# Patient Record
Sex: Male | Born: 1985 | Race: White | Hispanic: No | Marital: Single | Smoking: Current every day smoker
Health system: Southern US, Community
[De-identification: ages and names within clinical notes are randomized; demographics above are authoritative.]

## PROBLEM LIST (undated history)

## (undated) DIAGNOSIS — F32A Depression, unspecified: Secondary | ICD-10-CM

## (undated) DIAGNOSIS — F319 Bipolar disorder, unspecified: Secondary | ICD-10-CM

## (undated) DIAGNOSIS — F329 Major depressive disorder, single episode, unspecified: Secondary | ICD-10-CM

## (undated) DIAGNOSIS — F4000 Agoraphobia, unspecified: Secondary | ICD-10-CM

## (undated) DIAGNOSIS — F419 Anxiety disorder, unspecified: Secondary | ICD-10-CM

## (undated) DIAGNOSIS — F4320 Adjustment disorder, unspecified: Secondary | ICD-10-CM

## (undated) DIAGNOSIS — E119 Type 2 diabetes mellitus without complications: Secondary | ICD-10-CM

## (undated) HISTORY — DX: Anxiety disorder, unspecified: F41.9

## (undated) HISTORY — DX: Adjustment disorder, unspecified: F43.20

## (undated) HISTORY — DX: Agoraphobia, unspecified: F40.00

## (undated) HISTORY — DX: Type 2 diabetes mellitus without complications: E11.9

## (undated) HISTORY — DX: Bipolar disorder, unspecified: F31.9

## (undated) HISTORY — DX: Depression, unspecified: F32.A

## (undated) HISTORY — DX: Major depressive disorder, single episode, unspecified: F32.9

---

## 2014-11-13 ENCOUNTER — Ambulatory Visit: Payer: Self-pay | Admitting: Family Medicine

## 2015-01-13 ENCOUNTER — Ambulatory Visit: Payer: Self-pay | Admitting: Unknown Physician Specialty

## 2015-01-21 ENCOUNTER — Ambulatory Visit: Payer: Self-pay | Admitting: Unknown Physician Specialty

## 2015-02-17 ENCOUNTER — Ambulatory Visit (INDEPENDENT_AMBULATORY_CARE_PROVIDER_SITE_OTHER): Payer: Medicaid Other | Admitting: Unknown Physician Specialty

## 2015-02-17 ENCOUNTER — Encounter: Payer: Self-pay | Admitting: Unknown Physician Specialty

## 2015-02-17 VITALS — BP 154/95 | HR 68 | Temp 98.2°F | Ht 72.5 in | Wt 277.6 lb

## 2015-02-17 DIAGNOSIS — F410 Panic disorder [episodic paroxysmal anxiety] without agoraphobia: Secondary | ICD-10-CM | POA: Diagnosis not present

## 2015-02-17 DIAGNOSIS — F4323 Adjustment disorder with mixed anxiety and depressed mood: Secondary | ICD-10-CM | POA: Diagnosis not present

## 2015-02-17 DIAGNOSIS — F4001 Agoraphobia with panic disorder: Secondary | ICD-10-CM | POA: Insufficient documentation

## 2015-02-17 DIAGNOSIS — F329 Major depressive disorder, single episode, unspecified: Secondary | ICD-10-CM | POA: Insufficient documentation

## 2015-02-17 DIAGNOSIS — Z23 Encounter for immunization: Secondary | ICD-10-CM

## 2015-02-17 DIAGNOSIS — F332 Major depressive disorder, recurrent severe without psychotic features: Secondary | ICD-10-CM | POA: Diagnosis not present

## 2015-02-17 DIAGNOSIS — F4320 Adjustment disorder, unspecified: Secondary | ICD-10-CM | POA: Insufficient documentation

## 2015-02-17 MED ORDER — ARIPIPRAZOLE 5 MG PO TABS: 5.0000 mg | ORAL_TABLET | Freq: Every day | ORAL | Status: DC

## 2015-02-17 NOTE — Progress Notes (Signed)
BP 154/95 mmHg  Pulse 68  Temp(Src) 98.2 F (36.8 C)  Ht 6' 0.5" (1.842 m)  Wt 277 lb 9.6 oz (125.919 kg)  BMI 37.11 kg/m2   Subjective:    Patient ID: Kevin Meza, male    DOB: [birth year], 29 y.o.   MRN: [id number]  HPI: Kevin Meza is a 29 y.o. male  Chief Complaint  Patient presents with  . Establish Care    pt states he is very depressed and sad a lot of time, states he also has crying fits   Depression: Pt is here with his mother to discuss problems with depression. Mother states it has been going on for "quite some time."  He was not diagnosed with depression when he was younger but mother noticed he always played alone and always by himself.  Mother states bipolar runs on her side of the family.  He brought in an assessment by his counselor with a diagnosis of Agoraphobia, Generalized Anxiety disorder, Major depression, and adjustment disorder.  He can't keep a job due to anxiety, panic and depression.    He has troubles sleeping, he is tired and having little energy, problems eating, feeling bad about himself and trouble concentrating.  He states he has no thoughts about hurting himself in any way.  PHQ 9 is 18  He describes panic attacks every other day, states he has daily swings, he has crying spells, sleeps all day if he could.  He ++  Relevant past medical, surgical, family and social history reviewed and updated as indicated. Interim medical history since our last visit reviewed. Allergies and medications reviewed and updated.  Review of Systems  Per HPI unless specifically indicated above     Objective:    BP 154/95 mmHg  Pulse 68  Temp(Src) 98.2 F (36.8 C)  Ht 6' 0.5" (1.842 m)  Wt 277 lb 9.6 oz (125.919 kg)  BMI 37.11 kg/m2  Wt Readings from Last 3 Encounters:  02/17/15 277 lb 9.6 oz (125.919 kg)    Physical Exam  Constitutional: He is oriented to person, place, and time. He appears well-developed and well-nourished. No distress.  HENT:   Head: Normocephalic and atraumatic.  Eyes: Conjunctivae and lids are normal. Right eye exhibits no discharge. Left eye exhibits no discharge. No scleral icterus.  Cardiovascular: Normal rate, regular rhythm and normal heart sounds.   Pulmonary/Chest: Effort normal and breath sounds normal.  Abdominal: Normal appearance and bowel sounds are normal. He exhibits no distension. There is no splenomegaly or hepatomegaly. There is no tenderness.  Musculoskeletal: Normal range of motion.  Neurological: He is alert and oriented to person, place, and time.  Skin: Skin is intact. No rash noted. No pallor.  Psychiatric: He has a normal mood and affect. His behavior is normal. Judgment and thought content normal.    No results found for this or any previous visit.    Assessment & Plan:   Problem List Items Addressed This Visit      Unprioritized   Major depression   Agoraphobia with panic disorder   Panic disorder   Adjustment disorder    Other Visit Diagnoses    Immunization due    -  Primary    Relevant Orders    Flu Vaccine QUAD 36+ mos PF IM (Fluarix & Fluzone Quad PF) (Completed)       Discussed with Dr. Dossie Arbour.  Will start Abilify 5 mg QD with the option to increase to 10 mg/day.  He does not  want to go to a psychiatrist and has trouble trusting people.  Discussed no SSRIs right now due to possibility of bipolar  Follow up plan: Return in about 4 weeks (around 03/17/2015).

## 2015-02-17 NOTE — Patient Instructions (Signed)

## 2015-02-28 ENCOUNTER — Telehealth: Payer: Self-pay | Admitting: Unknown Physician Specialty

## 2015-02-28 NOTE — Telephone Encounter (Signed)
Called and spoke to patient. Patient states he is a frequent headache person and usually has them on the left side. Patient stated he has been doing good on the Abilify but since he has been taking it, he now is having stronger headaches on the right side and it makes the whole top of his head hurt. States when he gets the migraine "he just shuts down". Patient wants to know if there is something he can do for the migraines, or what exactly he should do. Patient also wants to know if he should take the Abilify tonight or if he should hold off. States he take it around 10:30 every night. I told him I would call him back before we leave today.

## 2015-02-28 NOTE — Telephone Encounter (Signed)
Ask him to try taking 1/2 of his Ability.  Thanks.

## 2015-02-28 NOTE — Telephone Encounter (Signed)
Pt requests call back about migraines. Please call ASAP. Thanks.

## 2015-02-28 NOTE — Telephone Encounter (Signed)
Called and let patient know what Cheryl said.  

## 2015-03-12 ENCOUNTER — Ambulatory Visit: Payer: Medicaid Other | Admitting: Unknown Physician Specialty

## 2015-03-14 ENCOUNTER — Encounter: Payer: Self-pay | Admitting: Unknown Physician Specialty

## 2015-03-14 ENCOUNTER — Ambulatory Visit (INDEPENDENT_AMBULATORY_CARE_PROVIDER_SITE_OTHER): Payer: Medicaid Other | Admitting: Unknown Physician Specialty

## 2015-03-14 VITALS — BP 124/81 | HR 63 | Temp 98.2°F | Ht 72.05 in | Wt 283.6 lb

## 2015-03-14 DIAGNOSIS — F3130 Bipolar disorder, current episode depressed, mild or moderate severity, unspecified: Secondary | ICD-10-CM

## 2015-03-14 DIAGNOSIS — F410 Panic disorder [episodic paroxysmal anxiety] without agoraphobia: Secondary | ICD-10-CM | POA: Diagnosis not present

## 2015-03-14 DIAGNOSIS — F319 Bipolar disorder, unspecified: Secondary | ICD-10-CM

## 2015-03-14 MED ORDER — LURASIDONE HCL 20 MG PO TABS: 20.0000 mg | ORAL_TABLET | Freq: Every day | ORAL | Status: DC

## 2015-03-14 NOTE — Patient Instructions (Signed)
Book "Don't Panic"  [facility] [phone] in [place]

## 2015-03-14 NOTE — Progress Notes (Signed)
   BP 124/81 mmHg  Pulse 63  Temp(Src) 98.2 F (36.8 C)  Ht 6' 0.05" (1.83 m)  Wt 283 lb 9.6 oz (128.64 kg)  BMI 38.41 kg/m2  SpO2 98%   Subjective:    Patient ID: Kevin Meza, male    DOB: [birth year], 29 y.o.   MRN: [id number]  HPI: Kevin Meza is a 29 y.o. male  Chief Complaint  Patient presents with  . Follow-up    F/u for depression, bipolar disorder, and to review amblify to see how it's working.  . Depression    Patient states he's still depressed  . Migraine    X's 2 days a week   Depression Pt is taking 5 mg of Abilify.  Pt states he is still depressed.  He did get a migraine when he first started but migraines are typical for hem.  Mom is here with him who feels the Abilify seems to be working in the AM but not in the afternoon.  No suicidal ideation.  He is teary and emotional.  He has not been on Latuda or any other mood stabilizer.  He is willing to see psychiatry.  He does go to counseling.  Does feel a little leveling   Relevant past medical, surgical, family and social history reviewed and updated as indicated. Interim medical history since our last visit reviewed. Allergies and medications reviewed and updated.  Review of Systems  Per HPI unless specifically indicated above     Objective:    BP 124/81 mmHg  Pulse 63  Temp(Src) 98.2 F (36.8 C)  Ht 6' 0.05" (1.83 m)  Wt 283 lb 9.6 oz (128.64 kg)  BMI 38.41 kg/m2  SpO2 98%  Wt Readings from Last 3 Encounters:  03/14/15 283 lb 9.6 oz (128.64 kg)  02/17/15 277 lb 9.6 oz (125.919 kg)    Physical Exam  Constitutional: He is oriented to person, place, and time. He appears well-developed and well-nourished. No distress.  HENT:  Head: Normocephalic and atraumatic.  Eyes: Conjunctivae and lids are normal. Right eye exhibits no discharge. Left eye exhibits no discharge. No scleral icterus.  Pulmonary/Chest: No respiratory distress.  Abdominal: Normal appearance. There is no splenomegaly or  hepatomegaly.  Musculoskeletal: Normal range of motion.  Neurological: He is alert and oriented to person, place, and time.  Skin: Skin is intact. No rash noted. No pallor.  Psychiatric: He has a normal mood and affect. His behavior is normal. Judgment and thought content normal.    No results found for this or any previous visit.    Assessment & Plan:   Problem List Items Addressed This Visit      Unprioritized   Panic disorder    Other Visit Diagnoses    Bipolar depression (HCC)    -  Primary    Failed Abilify.  Start Latuda 20 mg QD.      Relevant Orders    Ambulatory referral to Psychiatry        Follow up plan: Return in about 3 weeks (around 04/04/2015).

## 2015-03-18 ENCOUNTER — Ambulatory Visit: Payer: Medicaid Other | Admitting: Unknown Physician Specialty

## 2015-03-31 ENCOUNTER — Encounter: Payer: Self-pay | Admitting: Psychiatry

## 2015-03-31 ENCOUNTER — Ambulatory Visit (INDEPENDENT_AMBULATORY_CARE_PROVIDER_SITE_OTHER): Payer: Medicaid Other | Admitting: Psychiatry

## 2015-03-31 VITALS — BP 118/82 | HR 92 | Temp 97.8°F | Ht 72.0 in | Wt 288.2 lb

## 2015-03-31 DIAGNOSIS — F3132 Bipolar disorder, current episode depressed, moderate: Secondary | ICD-10-CM | POA: Diagnosis not present

## 2015-03-31 MED ORDER — LURASIDONE HCL 40 MG PO TABS: 40.0000 mg | ORAL_TABLET | Freq: Every day | ORAL | Status: DC

## 2015-03-31 MED ORDER — HYDROXYZINE PAMOATE 50 MG PO CAPS: 50.0000 mg | ORAL_CAPSULE | Freq: Every day | ORAL | Status: DC
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
## 2015-04-04 ENCOUNTER — Encounter: Payer: Self-pay | Admitting: Unknown Physician Specialty

## 2015-04-04 ENCOUNTER — Ambulatory Visit (INDEPENDENT_AMBULATORY_CARE_PROVIDER_SITE_OTHER): Payer: Medicaid Other | Admitting: Unknown Physician Specialty

## 2015-04-04 VITALS — BP 151/85 | HR 84 | Temp 98.4°F | Ht 72.6 in | Wt 282.6 lb

## 2015-04-04 DIAGNOSIS — F332 Major depressive disorder, recurrent severe without psychotic features: Secondary | ICD-10-CM | POA: Diagnosis not present

## 2015-04-04 DIAGNOSIS — M549 Dorsalgia, unspecified: Secondary | ICD-10-CM

## 2015-04-04 DIAGNOSIS — Z720 Tobacco use: Secondary | ICD-10-CM | POA: Insufficient documentation

## 2015-04-04 DIAGNOSIS — G8929 Other chronic pain: Secondary | ICD-10-CM

## 2015-04-04 DIAGNOSIS — L858 Other specified epidermal thickening: Secondary | ICD-10-CM | POA: Insufficient documentation

## 2015-04-04 DIAGNOSIS — Q829 Congenital malformation of skin, unspecified: Secondary | ICD-10-CM

## 2015-04-04 DIAGNOSIS — Z23 Encounter for immunization: Secondary | ICD-10-CM

## 2015-04-04 NOTE — Assessment & Plan Note (Signed)
Discussed with patient that his sedentary lifestyle, obesity and smoking all put a lot of strain on his back.  Discussed PT but pt lacks transportation.  He will consider u tube videos focusing on yoga for back pain

## 2015-04-04 NOTE — Assessment & Plan Note (Signed)
Patient ed.  Use OTC Lac Hydrin or Eucerin lotion

## 2015-04-04 NOTE — Assessment & Plan Note (Signed)
He would like to quit and I feel in the long run this will help his mood.  But, I am encouraging him to wait until medication is adjusted.

## 2015-04-04 NOTE — Progress Notes (Signed)
BP 151/85 mmHg  Pulse 84  Temp(Src) 98.4 F (36.9 C)  Ht 6' 0.6" (1.844 m)  Wt 282 lb 9.6 oz (128.187 kg)  BMI 37.70 kg/m2  SpO2 95%   Subjective:    Patient ID: Kevin Meza, male    DOB: [birth year], 29 y.o.   MRN: [id number]  HPI: Kevin PlowmanMatthew Baumler is a 29 y.o. male  Chief Complaint  Patient presents with  . Depression  . Back Pain    pt states he has been having some back pain mainly on right side, states he used to skate and had some falls and back has been hurting ever since  . Rash    pt states he has little bumps that itch on the inside of both arms and legs  . Nicotine Dependence    pt states he wants to talk about quitting smoking   Depression Pt states Kasandra KnudsenLatuda is working well. He went to see a psychiatrist and it didn't go well.  Kasandra KnudsenLatuda was doubled and changed the time of day.  He hasn't tried it yet.  She added Vistaril in the PM.  He has not returned to his counselor due to distance.   [facility] 04/04/2015 02/17/2015  Decreased Interest 2 3  Down, Depressed, Hopeless 3 3  PHQ - 2 Score 5 6  Altered sleeping 2 3  Tired, decreased energy 3 3  Change in appetite 3 2  Feeling bad or failure about yourself  3 3  Trouble concentrating 1 2  Moving slowly or fidgety/restless 2 0  Suicidal thoughts 0 2  PHQ-9 Score 19 21    Tobacco dependence Would like to quit smoking.  Pt states he has been able to quit smoking on the patches.      Back Pain This is a chronic (4 years ago fell on skateboard.  Broke wrist and wasn't able to do anything about  his back.  Sometimes it flares and can't tget off the floor.  Back rus helped.  ) problem. The problem occurs intermittently. The problem is unchanged. Pertinent negatives include no fever.  Rash Chronicity: This has been going for years.  He notices multiple small red bumps arms and thights.   The current episode started more than 1 year ago. The problem has been waxing and waning since onset. Rash  characteristics: No itching. He was exposed to nothing. Pertinent negatives include no fatigue or fever. Past treatments include nothing.     Relevant past medical, surgical, family and social history reviewed and updated as indicated. Interim medical history since our last visit reviewed. Allergies and medications reviewed and updated.  Review of Systems  Constitutional: Negative for fever and fatigue.  Musculoskeletal: Positive for back pain.  Skin: Positive for rash.    Per HPI unless specifically indicated above     Objective:    BP 151/85 mmHg  Pulse 84  Temp(Src) 98.4 F (36.9 C)  Ht 6' 0.6" (1.844 m)  Wt 282 lb 9.6 oz (128.187 kg)  BMI 37.70 kg/m2  SpO2 95%  Wt Readings from Last 3 Encounters:  04/04/15 282 lb 9.6 oz (128.187 kg)  03/31/15 288 lb 3.2 oz (130.727 kg)  03/14/15 283 lb 9.6 oz (128.64 kg)    Physical Exam  Constitutional: He is oriented to person, place, and time. He appears well-developed and well-nourished. No distress.  HENT:  Head: Normocephalic and atraumatic.  Eyes: Conjunctivae and lids are normal. Right eye exhibits no discharge. Left eye exhibits no  discharge. No scleral icterus.  Cardiovascular: Normal rate and regular rhythm.   Pulmonary/Chest: Effort normal and breath sounds normal. No respiratory distress.  Abdominal: Normal appearance and bowel sounds are normal. There is no splenomegaly or hepatomegaly.  Musculoskeletal: Normal range of motion.  Neurological: He is alert and oriented to person, place, and time.  Skin: Skin is intact. No rash noted. No pallor.  Psychiatric: He has a normal mood and affect. His behavior is normal. Judgment and thought content normal.    No results found for this or any previous visit.    Assessment & Plan:   Problem List Items Addressed This Visit      Unprioritized   Major depression (HCC)    Continue medication management with psychiatrist.  List of local counselors given.        Keratosis  pilaris    Patient ed.  Use OTC Lac Hydrin or Eucerin lotion      Chronic back pain    Discussed with patient that his sedentary lifestyle, obesity and smoking all put a lot of strain on his back.  Discussed PT but pt lacks transportation.  He will consider u tube videos focusing on yoga for back pain      Tobacco abuse    He would like to quit and I feel in the long run this will help his mood.  But, I am encouraging him to wait until medication is adjusted.         Other Visit Diagnoses    Immunization due    -  Primary    Relevant Orders    Tdap vaccine greater than or equal to 7yo IM (Completed)        Follow up plan: Return in about 3 months (around 07/05/2015).

## 2015-04-04 NOTE — Assessment & Plan Note (Signed)
Continue medication management with psychiatrist.  List of local counselors given.

## 2015-05-01 ENCOUNTER — Ambulatory Visit: Payer: Medicaid Other | Admitting: Psychiatry

## 2015-05-13 ENCOUNTER — Ambulatory Visit: Payer: Medicaid Other | Admitting: Psychiatry

## 2015-05-28 ENCOUNTER — Telehealth: Payer: Self-pay | Admitting: Unknown Physician Specialty

## 2015-05-28 NOTE — Telephone Encounter (Signed)
Routing to provider  

## 2015-05-28 NOTE — Telephone Encounter (Signed)
Pt needs a note for food stamp application stating that he is mentally unfit/unstable for work. He says that this was discussed at a previous appt and that he needs this letter to explain why he need the assistance. He would like it to be faxed to [phone] by Friday.

## 2015-05-29 NOTE — Telephone Encounter (Signed)
Patient returned my call and I let him know that Kevin MaxwellCheryl wants him to call his psychiatrist for the letter he is requesting.

## 2015-05-29 NOTE — Telephone Encounter (Signed)
I need his psychiatrist or counselor to write this.

## 2015-05-29 NOTE — Progress Notes (Signed)
Pharmacy notified.

## 2015-05-29 NOTE — Telephone Encounter (Signed)
Called and left patient a voicemail asking for him to please return my call.  

## 2015-06-05 ENCOUNTER — Encounter: Payer: Self-pay | Admitting: Psychiatry

## 2015-06-05 ENCOUNTER — Ambulatory Visit (INDEPENDENT_AMBULATORY_CARE_PROVIDER_SITE_OTHER): Payer: Medicaid Other | Admitting: Psychiatry

## 2015-06-05 VITALS — BP 122/78 | HR 93 | Temp 97.1°F | Ht 72.5 in | Wt 288.0 lb

## 2015-06-05 DIAGNOSIS — F3132 Bipolar disorder, current episode depressed, moderate: Secondary | ICD-10-CM | POA: Diagnosis not present

## 2015-06-05 MED ORDER — HYDROXYZINE PAMOATE 25 MG PO CAPS: 25.0000 mg | ORAL_CAPSULE | Freq: Every day | ORAL | Status: DC

## 2015-06-05 MED ORDER — LURASIDONE HCL 40 MG PO TABS: 40.0000 mg | ORAL_TABLET | Freq: Every day | ORAL | Status: DC

## 2015-06-05 MED ORDER — DIVALPROEX SODIUM 250 MG PO DR TAB: 250.0000 mg | DELAYED_RELEASE_TABLET | Freq: Every evening | ORAL | Status: DC
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
## 2015-06-06 ENCOUNTER — Ambulatory Visit: Payer: Self-pay | Admitting: Unknown Physician Specialty

## 2015-06-27 ENCOUNTER — Encounter: Payer: Self-pay | Admitting: Unknown Physician Specialty

## 2015-07-09 ENCOUNTER — Ambulatory Visit (INDEPENDENT_AMBULATORY_CARE_PROVIDER_SITE_OTHER): Payer: Medicaid Other | Admitting: Unknown Physician Specialty

## 2015-07-09 ENCOUNTER — Encounter: Payer: Self-pay | Admitting: Unknown Physician Specialty

## 2015-07-09 VITALS — BP 159/96 | HR 76 | Temp 98.1°F | Ht 72.6 in | Wt 289.4 lb

## 2015-07-09 DIAGNOSIS — S63502A Unspecified sprain of left wrist, initial encounter: Secondary | ICD-10-CM

## 2015-07-09 DIAGNOSIS — F332 Major depressive disorder, recurrent severe without psychotic features: Secondary | ICD-10-CM | POA: Diagnosis not present

## 2015-07-09 DIAGNOSIS — N62 Hypertrophy of breast: Secondary | ICD-10-CM | POA: Insufficient documentation

## 2015-07-09 NOTE — Progress Notes (Signed)
BP 159/96 mmHg  Pulse 76  Temp(Src) 98.1 F (36.7 C)  Ht 6' 0.6" (1.844 m)  Wt 289 lb 6.4 oz (131.271 kg)  BMI 38.61 kg/m2  SpO2 97%   Subjective:    Patient ID: Kevin Meza, male    DOB: [birth year], 30 y.o.   MRN: [id number]  HPI: Kevin Meza is a 30 y.o. male  Chief Complaint  Patient presents with  . Depression  . Wrist Pain    pt states he has been having pain in left writst that started Sunday  . Pain    pt states he has had a pain that comes and goes under right nipple. States it first started a few months ago.   Depression He is seeing a psychiatrist and taking Latuda and Depakote.  Has some questions about the Depakote prescribed last month which he hasn't started yet.  He is in good spirits today.    Wrist pain Broke wrist 3 years ago.   Pushed up 3 days ago and experienced severe pain.  He is able to move it but repetitive motion gives it a problem.    Chest wall pain Bump under right nipple (3-4 months) that has been there for a while.  Hurts if he brushes against something    Relevant past medical, surgical, family and social history reviewed and updated as indicated. Interim medical history since our last visit reviewed. Allergies and medications reviewed and updated.  Review of Systems  Per HPI unless specifically indicated above     Objective:    BP 159/96 mmHg  Pulse 76  Temp(Src) 98.1 F (36.7 C)  Ht 6' 0.6" (1.844 m)  Wt 289 lb 6.4 oz (131.271 kg)  BMI 38.61 kg/m2  SpO2 97%  Wt Readings from Last 3 Encounters:  07/09/15 289 lb 6.4 oz (131.271 kg)  06/05/15 288 lb (130.636 kg)  04/04/15 282 lb 9.6 oz (128.187 kg)    Physical Exam  Constitutional: He is oriented to person, place, and time. He appears well-developed and well-nourished. No distress.  HENT:  Head: Normocephalic and atraumatic.  Eyes: Conjunctivae and lids are normal. Right eye exhibits no discharge. Left eye exhibits no discharge. No scleral icterus.  Neck: Normal  range of motion. Neck supple. No JVD present. Carotid bruit is not present.  Cardiovascular: Normal rate, regular rhythm and normal heart sounds.   Pulmonary/Chest: Effort normal and breath sounds normal. No respiratory distress. He exhibits tenderness. He exhibits no mass. Right breast exhibits tenderness. Right breast exhibits no inverted nipple, no mass, no nipple discharge and no skin change.  Mild tenderness without mass right nipple  Abdominal: Normal appearance. There is no splenomegaly or hepatomegaly.  Musculoskeletal: Normal range of motion.       Left wrist: He exhibits tenderness.  Neurological: He is alert and oriented to person, place, and time.  Skin: Skin is warm, dry and intact. No rash noted. No pallor.  Psychiatric: He has a normal mood and affect. His behavior is normal. Judgment and thought content normal.    No results found for this or any previous visit.    Assessment & Plan:   Problem List Items Addressed This Visit      Unprioritized   Major depression [facility])    Reviewed psychiatry notes      Gynecomastia, male    Pt ed.  Weight loss.  May want to discuss psychiatric meds with psychiatrist       Other Visit Diagnoses  Wrist sprain, left, initial encounter    -  Primary    Wrist splint        Follow up plan: Return for physical.

## 2015-07-09 NOTE — Assessment & Plan Note (Signed)
Pt ed.  Weight loss.  May want to discuss psychiatric meds with psychiatrist

## 2015-07-09 NOTE — Assessment & Plan Note (Signed)
Reviewed psychiatry notes

## 2015-09-02 ENCOUNTER — Encounter: Payer: Medicaid Other | Admitting: Unknown Physician Specialty

## 2015-09-05 ENCOUNTER — Encounter: Payer: Self-pay | Admitting: Psychiatry

## 2015-09-05 ENCOUNTER — Ambulatory Visit (INDEPENDENT_AMBULATORY_CARE_PROVIDER_SITE_OTHER): Payer: Medicaid Other | Admitting: Psychiatry

## 2015-09-05 VITALS — BP 122/80 | HR 68 | Temp 97.4°F | Ht 72.05 in | Wt 273.2 lb

## 2015-09-05 DIAGNOSIS — F3130 Bipolar disorder, current episode depressed, mild or moderate severity, unspecified: Secondary | ICD-10-CM | POA: Diagnosis not present

## 2015-09-05 MED ORDER — TRAZODONE HCL 100 MG PO TABS: 100.0000 mg | ORAL_TABLET | Freq: Every day | ORAL | Status: DC

## 2015-09-05 MED ORDER — LURASIDONE HCL 60 MG PO TABS: 60.0000 mg | ORAL_TABLET | Freq: Every day | ORAL | Status: DC

## 2015-09-05 MED ORDER — DIVALPROEX SODIUM 500 MG PO DR TAB: 500.0000 mg | DELAYED_RELEASE_TABLET | Freq: Every evening | ORAL | Status: DC
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
## 2015-09-25 ENCOUNTER — Telehealth: Payer: Self-pay

## 2015-09-25 NOTE — Telephone Encounter (Signed)
pt called states that the trazodone is not working  he has been getting 2 hours of sleep

## 2015-09-25 NOTE — Telephone Encounter (Signed)
Please advise him to come in morning so we can discuss about his medication.

## 2015-09-25 NOTE — Telephone Encounter (Signed)
left message that dr. Garnetta Buddyfaheem would like for him to be seen tomorrow about trazodone issues.   asked pt to call our office back with an appt

## 2015-09-26 ENCOUNTER — Encounter: Payer: Self-pay | Admitting: Psychiatry

## 2015-09-26 ENCOUNTER — Ambulatory Visit (INDEPENDENT_AMBULATORY_CARE_PROVIDER_SITE_OTHER): Payer: Medicaid Other | Admitting: Psychiatry

## 2015-09-26 VITALS — BP 118/82 | HR 86 | Temp 97.3°F | Ht 72.5 in | Wt 268.2 lb

## 2015-09-26 DIAGNOSIS — F3130 Bipolar disorder, current episode depressed, mild or moderate severity, unspecified: Secondary | ICD-10-CM

## 2015-09-26 MED ORDER — DIVALPROEX SODIUM 250 MG PO DR TAB: 750.0000 mg | DELAYED_RELEASE_TABLET | Freq: Every evening | ORAL | Status: DC

## 2015-09-26 MED ORDER — TRAZODONE HCL 100 MG PO TABS: 200.0000 mg | ORAL_TABLET | Freq: Every day | ORAL | Status: DC

## 2015-09-26 MED ORDER — LURASIDONE HCL 60 MG PO TABS: 40.0000 mg | ORAL_TABLET | Freq: Every day | ORAL | Status: DC
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
## 2015-10-06 ENCOUNTER — Ambulatory Visit: Payer: Medicaid Other | Admitting: Psychiatry

## 2015-10-20 ENCOUNTER — Encounter: Payer: Self-pay | Admitting: Unknown Physician Specialty

## 2015-10-20 ENCOUNTER — Ambulatory Visit (INDEPENDENT_AMBULATORY_CARE_PROVIDER_SITE_OTHER): Payer: Medicaid Other | Admitting: Unknown Physician Specialty

## 2015-10-20 VITALS — BP 122/85 | HR 70 | Temp 97.8°F | Ht 72.5 in | Wt 260.2 lb

## 2015-10-20 DIAGNOSIS — F3181 Bipolar II disorder: Secondary | ICD-10-CM

## 2015-10-20 DIAGNOSIS — K219 Gastro-esophageal reflux disease without esophagitis: Secondary | ICD-10-CM | POA: Diagnosis not present

## 2015-10-20 DIAGNOSIS — Z0000 Encounter for general adult medical examination without abnormal findings: Secondary | ICD-10-CM

## 2015-10-20 DIAGNOSIS — Z5181 Encounter for therapeutic drug level monitoring: Secondary | ICD-10-CM

## 2015-10-20 DIAGNOSIS — Z720 Tobacco use: Secondary | ICD-10-CM

## 2015-10-20 MED ORDER — OMEPRAZOLE 20 MG PO CPDR: 20.0000 mg | DELAYED_RELEASE_CAPSULE | Freq: Every day | ORAL | Status: AC

## 2015-10-20 NOTE — Assessment & Plan Note (Signed)
Start Omeprazole 

## 2015-10-20 NOTE — Assessment & Plan Note (Signed)
Per psychiatry 

## 2015-10-20 NOTE — Assessment & Plan Note (Signed)
Discussed quitting smoking 

## 2015-10-20 NOTE — Progress Notes (Signed)
BP 122/85 mmHg  Pulse 70  Temp(Src) 97.8 F (36.6 C)  Ht 6' 0.5" (1.842 m)  Wt 260 lb 3.2 oz (118.026 kg)  BMI 34.79 kg/m2  SpO2 95%   Subjective:    Patient ID: Kevin Meza, male    DOB: 27-Apr-1986, 30 y.o.   MRN: [id number]  HPI: Kevin PlowmanMatthew Gradillas is a 30 y.o. male  Chief Complaint  Patient presents with  . Annual Exam   Bipolar Through psychiatry  [facility] 10/20/2015 04/04/2015 02/17/2015  Decreased Interest 3 2 3   Down, Depressed, Hopeless 2 3 3   PHQ - 2 Score 5 5 6   Altered sleeping 2 2 3   Tired, decreased energy 3 3 3   Change in appetite 2 3 2   Feeling bad or failure about yourself  2 3 3   Trouble concentrating 1 1 2   Moving slowly or fidgety/restless 2 2 0  Suicidal thoughts 0 0 2  PHQ-9 Score 17 19 21     Social History   Social History  . Marital Status: Unknown    Spouse Name: N/A  . Number of Children: N/A  . Years of Education: N/A   Occupational History  . Not on file.   Social History Main Topics  . Smoking status: Current Every Day Smoker -- 0.50 packs/day    Types: Cigarettes    Start date: 03/30/2005  . Smokeless tobacco: Never Used  . Alcohol Use: No  . Drug Use: Yes    Special: Marijuana     Comment: pt states very rare  last used 3 months ago  . Sexual Activity: No   Other Topics Concern  . Not on file   Social History Narrative   Family History  Problem Relation Age of Onset  . Mitral valve prolapse Mother   . Emphysema Mother   . Hypertension Sister   . Allergies Sister   . Bipolar disorder Maternal Aunt   . Bipolar disorder Maternal Grandmother   . Hypertension Father   . Cancer Paternal Grandfather   . Bipolar disorder Maternal Uncle    Past Medical History  Diagnosis Date  . Agoraphobia   . Anxiety   . Depression   . Adjustment disorder   . Bipolar disorder (HCC)    History reviewed. No pertinent past surgical history.   Relevant past medical, surgical, family and social history reviewed and  updated as indicated. Interim medical history since our last visit reviewed. Allergies and medications reviewed and updated.  Review of Systems  Constitutional: Negative.   HENT: Negative.   Eyes: Negative.   Respiratory: Negative.   Cardiovascular: Negative.   Gastrointestinal:       Significant GERD.  Takes Tums twice a day  Endocrine: Negative.   Genitourinary: Negative.   Skin: Negative.   Allergic/Immunologic: Negative.   Neurological: Negative.   Hematological: Negative.   Psychiatric/Behavioral:       As above    Per HPI unless specifically indicated above     Objective:    BP 122/85 mmHg  Pulse 70  Temp(Src) 97.8 F (36.6 C)  Ht 6' 0.5" (1.842 m)  Wt 260 lb 3.2 oz (118.026 kg)  BMI 34.79 kg/m2  SpO2 95%  Wt Readings from Last 3 Encounters:  10/20/15 260 lb 3.2 oz (118.026 kg)  09/26/15 268 lb 3.2 oz (121.655 kg)  09/05/15 273 lb 3.2 oz (123.923 kg)    Physical Exam  Constitutional: He is oriented to person, place, and time. He appears well-developed and  well-nourished.  HENT:  Head: Normocephalic.  Right Ear: Tympanic membrane, external ear and ear canal normal.  Left Ear: Tympanic membrane, external ear and ear canal normal.  Mouth/Throat: Uvula is midline, oropharynx is clear and moist and mucous membranes are normal.  Eyes: Pupils are equal, round, and reactive to light.  Cardiovascular: Normal rate, regular rhythm and normal heart sounds.  Exam reveals no gallop and no friction rub.   No murmur heard. Pulmonary/Chest: Effort normal and breath sounds normal. No respiratory distress.  Abdominal: Soft. Bowel sounds are normal. He exhibits no distension. There is no tenderness.  Musculoskeletal: Normal range of motion.  Neurological: He is alert and oriented to person, place, and time. He has normal reflexes.  Skin: Skin is warm and dry.  Psychiatric: He has a normal mood and affect. His behavior is normal. Judgment and thought content normal.    No  results found for this or any previous visit.    Assessment & Plan:   Problem List Items Addressed This Visit      Unprioritized   Bipolar 2 disorder (HCC)    Per psychiatry      Relevant Orders   CBC with Differential/Platelet   GERD (gastroesophageal reflux disease)    Start Omeprazole      Relevant Medications   omeprazole (PRILOSEC) 20 MG capsule   Tobacco abuse - Primary    Discussed quitting smoking       Other Visit Diagnoses    Annual physical exam        Relevant Orders    Lipid Panel w/o Chol/HDL Ratio    Comprehensive metabolic panel    Medication monitoring encounter        Relevant Orders    Comprehensive metabolic panel    CBC with Differential/Platelet        Follow up plan: Return in about 1 year (around 10/19/2016), or if symptoms worsen or fail to improve.

## 2015-10-21 ENCOUNTER — Telehealth: Payer: Self-pay | Admitting: Unknown Physician Specialty

## 2015-10-21 LAB — LIPID PANEL W/O CHOL/HDL RATIO
Cholesterol, Total: 293 mg/dL — ABNORMAL HIGH (ref 100–199)
HDL: 27 mg/dL — ABNORMAL LOW (ref >39)
TRIGLYCERIDES: 1013 mg/dL — AB (ref 0–149)

## 2015-10-21 LAB — CBC WITH DIFFERENTIAL/PLATELET
Basophils Absolute: 0 10*3/uL (ref 0.0–0.2)
Basos: 0 %
EOS (ABSOLUTE): 0.2 10*3/uL (ref 0.0–0.4)
EOS: 1 %
HEMATOCRIT: 46.8 % (ref 37.5–51.0)
Hemoglobin: 15.6 g/dL (ref 12.6–17.7)
Immature Grans (Abs): 0 10*3/uL (ref 0.0–0.1)
Immature Granulocytes: 0 %
LYMPHS ABS: 2.8 10*3/uL (ref 0.7–3.1)
Lymphs: 17 %
MCH: 32.6 pg (ref 26.6–33.0)
MCHC: 33.3 g/dL (ref 31.5–35.7)
MCV: 98 fL — AB (ref 79–97)
MONOS ABS: 1 10*3/uL — AB (ref 0.1–0.9)
Monocytes: 6 %
NEUTROS ABS: 13 10*3/uL — AB (ref 1.4–7.0)
Neutrophils: 76 %
Platelets: 277 10*3/uL (ref 150–379)
RBC: 4.79 x10E6/uL (ref 4.14–5.80)
RDW: 12.8 % (ref 12.3–15.4)
WBC: 17.1 10*3/uL — AB (ref 3.4–10.8)

## 2015-10-21 LAB — COMPREHENSIVE METABOLIC PANEL
ALBUMIN: 4.7 g/dL (ref 3.5–5.5)
ALK PHOS: 99 IU/L (ref 39–117)
ALT: 39 IU/L (ref 0–44)
AST: 21 IU/L (ref 0–40)
Albumin/Globulin Ratio: 1.8 (ref 1.2–2.2)
BILIRUBIN TOTAL: 0.3 mg/dL (ref 0.0–1.2)
BUN / CREAT RATIO: 17 (ref 9–20)
BUN: 12 mg/dL (ref 6–20)
CO2: 22 mmol/L (ref 18–29)
CREATININE: 0.7 mg/dL — AB (ref 0.76–1.27)
Calcium: 10.1 mg/dL (ref 8.7–10.2)
Chloride: 84 mmol/L — ABNORMAL LOW (ref 96–106)
GFR calc Af Amer: 147 mL/min/{1.73_m2} (ref >59)
GFR calc non Af Amer: 128 mL/min/{1.73_m2} (ref >59)
GLOBULIN, TOTAL: 2.6 g/dL (ref 1.5–4.5)
Glucose: 472 mg/dL — ABNORMAL HIGH (ref 65–99)
Potassium: 4.5 mmol/L (ref 3.5–5.2)
SODIUM: 128 mmol/L — AB (ref 134–144)
Total Protein: 7.3 g/dL (ref 6.0–8.5)

## 2015-10-21 NOTE — Telephone Encounter (Signed)
Called patient and no answer about labs.  I left a message to let him know someone will call and make him and appointment to evaluated elevated blood sugar.

## 2015-10-21 NOTE — Telephone Encounter (Signed)
Called and left patient a voicemail asking for him to please return my call.  

## 2015-10-22 NOTE — Telephone Encounter (Signed)
Pt scheduled for Friday 10/24/15 @ 9:30am. Pt wanted to come ASAP. Thanks.

## 2015-10-24 ENCOUNTER — Ambulatory Visit: Payer: Medicaid Other | Admitting: Psychiatry

## 2015-10-24 ENCOUNTER — Encounter: Payer: Self-pay | Admitting: Unknown Physician Specialty

## 2015-10-24 ENCOUNTER — Telehealth: Payer: Self-pay | Admitting: Unknown Physician Specialty

## 2015-10-24 ENCOUNTER — Ambulatory Visit (INDEPENDENT_AMBULATORY_CARE_PROVIDER_SITE_OTHER): Payer: Medicaid Other | Admitting: Unknown Physician Specialty

## 2015-10-24 VITALS — BP 119/80 | HR 64 | Temp 97.7°F | Ht 72.5 in | Wt 259.6 lb

## 2015-10-24 DIAGNOSIS — E1165 Type 2 diabetes mellitus with hyperglycemia: Secondary | ICD-10-CM | POA: Diagnosis not present

## 2015-10-24 DIAGNOSIS — D72829 Elevated white blood cell count, unspecified: Secondary | ICD-10-CM | POA: Insufficient documentation

## 2015-10-24 DIAGNOSIS — R739 Hyperglycemia, unspecified: Secondary | ICD-10-CM | POA: Diagnosis not present

## 2015-10-24 LAB — UA/M W/RFLX CULTURE, ROUTINE
BILIRUBIN UA: NEGATIVE
Leukocytes, UA: NEGATIVE
NITRITE UA: NEGATIVE
Protein, UA: NEGATIVE
RBC UA: NEGATIVE
Specific Gravity, UA: 1.01 (ref 1.005–1.030)
UUROB: 0.2 mg/dL (ref 0.2–1.0)
pH, UA: 7 (ref 5.0–7.5)

## 2015-10-24 LAB — GLUCOSE HEMOCUE WAIVED: Glu Hemocue Waived: 372 mg/dL — ABNORMAL HIGH (ref 65–99)

## 2015-10-24 LAB — MICROALBUMIN, URINE WAIVED
Creatinine, Urine Waived: 50 mg/dL (ref 10–300)
Microalb, Ur Waived: 10 mg/L (ref 0–19)
Microalb/Creat Ratio: <30 mg/g (ref <30)

## 2015-10-24 LAB — BAYER DCA HB A1C WAIVED: HB A1C (BAYER DCA - WAIVED): >14 % — ABNORMAL HIGH (ref <7.0)

## 2015-10-24 MED ORDER — BLOOD GLUCOSE MONITOR KIT: PACK | Status: AC

## 2015-10-24 MED ORDER — METFORMIN HCL 500 MG PO TABS: 500.0000 mg | ORAL_TABLET | Freq: Two times a day (BID) | ORAL | Status: AC

## 2015-10-24 NOTE — Progress Notes (Signed)
BP 119/80 mmHg  Pulse 64  Temp(Src) 97.7 F (36.5 C)  Ht 6' 0.5" (1.842 m)  Wt 259 lb 9.6 oz (117.754 kg)  BMI 34.71 kg/m2  SpO2 96%   Subjective:    Patient ID: Kevin Meza, male    DOB: 12-07-1985, 30 y.o.   MRN: [id number]  HPI: Kevin Meza is a 30 y.o. male  Chief Complaint  Patient presents with  . Hyperglycemia   Diabetes Pt is here for f/u for a new diagnosis of Diabetes.  Pt states he is thirsty and urinating all the time.  He and his mom are concerned about not having enough money to eat well.  He denies sugary drinks or foods.  Is drinking a lot of water at this time.     Relevant past medical, surgical, family and social history reviewed and updated as indicated. Interim medical history since our last visit reviewed. Allergies and medications reviewed and updated.  Review of Systems  Per HPI unless specifically indicated above     Objective:    BP 119/80 mmHg  Pulse 64  Temp(Src) 97.7 F (36.5 C)  Ht 6' 0.5" (1.842 m)  Wt 259 lb 9.6 oz (117.754 kg)  BMI 34.71 kg/m2  SpO2 96%  Wt Readings from Last 3 Encounters:  10/24/15 259 lb 9.6 oz (117.754 kg)  10/20/15 260 lb 3.2 oz (118.026 kg)  09/26/15 268 lb 3.2 oz (121.655 kg)    Physical Exam  Constitutional: He is oriented to person, place, and time. He appears well-developed and well-nourished. No distress.  HENT:  Head: Normocephalic and atraumatic.  Eyes: Conjunctivae and lids are normal. Right eye exhibits no discharge. Left eye exhibits no discharge. No scleral icterus.  Cardiovascular: Normal rate.   Pulmonary/Chest: Effort normal.  Abdominal: Normal appearance. There is no splenomegaly or hepatomegaly.  Musculoskeletal: Normal range of motion.  Neurological: He is alert and oriented to person, place, and time.  Skin: Skin is intact. No rash noted. No pallor.  Psychiatric: He has a normal mood and affect. His behavior is normal. Judgment and  thought content normal.   Glucose 376 Hgb A1C is greater than 14 1+ ketones but not acutely ill  Results for orders placed or performed in visit on 10/20/15  Lipid Panel w/o Chol/HDL Ratio  Result Value Ref Range   Cholesterol, Total 293 (H) 100 - 199 mg/dL   Triglycerides 11911013 (HH) 0 - 149 mg/dL   HDL 27 (L) >47>39 mg/dL   VLDL Cholesterol Cal Comment 5 - 40 mg/dL   LDL Calculated Comment 0 - 99 mg/dL  Comprehensive metabolic panel  Result Value Ref Range   Glucose 472 (H) 65 - 99 mg/dL   BUN 12 6 - 20 mg/dL   Creatinine, Ser 8.290.70 (L) 0.76 - 1.27 mg/dL   GFR calc non Af Amer 128 >59 mL/min/1.73   GFR calc Af Amer 147 >59 mL/min/1.73   BUN/Creatinine Ratio 17 9 - 20   Sodium 128 (L) 134 - 144 mmol/L   Potassium 4.5 3.5 - 5.2 mmol/L   Chloride 84 (L) 96 - 106 mmol/L   CO2 22 18 - 29 mmol/L   Calcium 10.1 8.7 - 10.2 mg/dL   Total Protein 7.3 6.0 - 8.5 g/dL   Albumin 4.7 3.5 - 5.5 g/dL   Globulin, Total 2.6 1.5 - 4.5 g/dL   Albumin/Globulin Ratio 1.8 1.2 - 2.2   Bilirubin Total 0.3 0.0 - 1.2 mg/dL   Alkaline Phosphatase 99 39 - 117  IU/L   AST 21 0 - 40 IU/L   ALT 39 0 - 44 IU/L  CBC with Differential/Platelet  Result Value Ref Range   WBC 17.1 (H) 3.4 - 10.8 x10E3/uL   RBC 4.79 4.14 - 5.80 x10E6/uL   Hemoglobin 15.6 12.6 - 17.7 g/dL   Hematocrit 16.1 09.6 - 51.0 %   MCV 98 (H) 79 - 97 fL   MCH 32.6 26.6 - 33.0 pg   MCHC 33.3 31.5 - 35.7 g/dL   RDW 04.5 40.9 - 81.1 %   Platelets 277 150 - 379 x10E3/uL   Neutrophils 76 %   Lymphs 17 %   Monocytes 6 %   Eos 1 %   Basos 0 %   Neutrophils Absolute 13.0 (H) 1.4 - 7.0 x10E3/uL   Lymphocytes Absolute 2.8 0.7 - 3.1 x10E3/uL   Monocytes Absolute 1.0 (H) 0.1 - 0.9 x10E3/uL   EOS (ABSOLUTE) 0.2 0.0 - 0.4 x10E3/uL   Basophils Absolute 0.0 0.0 - 0.2 x10E3/uL   Immature Granulocytes 0 %   Immature Grans (Abs) 0.0 0.0 - 0.1 x10E3/uL      Assessment & Plan:   Problem List Items Addressed This Visit      Unprioritized    Elevated WBC count    Noted at [facility].  Probably related to hyperglycemia.   Will recheck next visit      Relevant Orders   CBC With Differential/Platelet   Poorly controlled type 2 diabetes mellitus (HCC)    Start insulin QHS 10 units.  Check fasting blood sugar.  Refer to lifestyle center.  Start Metformin.  No sweets avoid white things.  Check c peptide next visit      Relevant Medications   metFORMIN (GLUCOPHAGE) 500 MG tablet   Other Relevant Orders   Microalbumin, Urine Waived   Ambulatory referral to diabetic education   UA/M w/rflx Culture, Routine   Glucose Hemocue Waived   C-peptide    Other Visit Diagnoses    Hyperglycemia    -  Primary    Relevant Orders    Glucose Hemocue Waived    Bayer DCA Hb A1c Waived    Microalbumin, Urine Waived    Ambulatory referral to diabetic education       Discussed pt with Dr. Laural Benes  Follow up plan: Return in about 1 year (around 10/23/2016) for recheck on Wednesday.

## 2015-10-24 NOTE — Telephone Encounter (Signed)
Pt called and stated that his blood sugar was 448 and would like to know if there is anything he should do.

## 2015-10-24 NOTE — Assessment & Plan Note (Addendum)
Start insulin QHS 10 units.  Check fasting blood sugar.  Refer to lifestyle center.  Start Metformin.  No sweets avoid white things.  Check c peptide next visit

## 2015-10-24 NOTE — Telephone Encounter (Signed)
Instructed to give himself another 10 units of insulin QHS.  Check AM blood sugars only at this time.

## 2015-10-24 NOTE — Assessment & Plan Note (Addendum)
Noted at [facility].  Probably related to hyperglycemia.   Will recheck next visit

## 2015-10-29 ENCOUNTER — Encounter: Payer: Self-pay | Admitting: Psychiatry

## 2015-10-29 ENCOUNTER — Ambulatory Visit (INDEPENDENT_AMBULATORY_CARE_PROVIDER_SITE_OTHER): Payer: Medicaid Other | Admitting: Unknown Physician Specialty

## 2015-10-29 ENCOUNTER — Encounter: Payer: Self-pay | Admitting: Unknown Physician Specialty

## 2015-10-29 ENCOUNTER — Ambulatory Visit (INDEPENDENT_AMBULATORY_CARE_PROVIDER_SITE_OTHER): Payer: Medicaid Other | Admitting: Psychiatry

## 2015-10-29 VITALS — BP 128/82 | HR 89 | Temp 97.7°F | Ht 72.5 in | Wt 254.8 lb

## 2015-10-29 VITALS — BP 122/86 | HR 86 | Temp 97.6°F | Ht 72.05 in | Wt 256.6 lb

## 2015-10-29 DIAGNOSIS — F3130 Bipolar disorder, current episode depressed, mild or moderate severity, unspecified: Secondary | ICD-10-CM

## 2015-10-29 DIAGNOSIS — E1165 Type 2 diabetes mellitus with hyperglycemia: Secondary | ICD-10-CM | POA: Diagnosis not present

## 2015-10-29 MED ORDER — INSULIN PEN NEEDLE 31G X 6 MM MISC: 1.0000 [IU] | Freq: Every day | Status: AC

## 2015-10-29 MED ORDER — DIVALPROEX SODIUM 250 MG PO DR TAB: 750.0000 mg | DELAYED_RELEASE_TABLET | Freq: Every evening | ORAL | Status: DC

## 2015-10-29 MED ORDER — TRAZODONE HCL 100 MG PO TABS: 200.0000 mg | ORAL_TABLET | Freq: Every day | ORAL | Status: DC

## 2015-10-29 MED ORDER — LURASIDONE HCL 40 MG PO TABS
40.0000 mg | ORAL_TABLET | Freq: Every day | ORAL | Status: DC
Start: 2015-10-29 — End: 2015-12-29

## 2015-10-29 NOTE — Patient Instructions (Signed)
Start with 20 units.    Base your long acting insulin Evaristo Bury(Tresiba) on your fasting (usually in the morning) blood sugar.  Increase long acting (daily insulin) 2 units if fasting blood sugar is greater than 120.  Decrease by 2 units if fasting blood sugar is less than 95.

## 2015-10-29 NOTE — Progress Notes (Signed)
BP 128/82 mmHg  Pulse 89  Temp(Src) 97.7 F (36.5 C)  Ht 6' 0.5" (1.842 m)  Wt 254 lb 12.8 oz (115.577 kg)  BMI 34.06 kg/m2  SpO2 95%   Subjective:    Patient ID: Kevin Meza, male    DOB: [birth year], 30 y.o.   MRN: [id number]  HPI: Kevin PlowmanMatthew Sisk is a 30 y.o. male  Chief Complaint  Patient presents with  . Diabetes   Diabetes Checking his blood sugar and finding morning blood sugar is around 300.  Apparently he is not using insulin as no needles at the end of the pen.  States his blood sugar did drop down to 209 yesterday and felt "wierd."  Relevant past medical, surgical, family and social history reviewed and updated as indicated. Interim medical history since our last visit reviewed. Allergies and medications reviewed and updated.  Review of Systems  Per HPI unless specifically indicated above     Objective:    BP 128/82 mmHg  Pulse 89  Temp(Src) 97.7 F (36.5 C)  Ht 6' 0.5" (1.842 m)  Wt 254 lb 12.8 oz (115.577 kg)  BMI 34.06 kg/m2  SpO2 95%  Wt Readings from Last 3 Encounters:  10/29/15 254 lb 12.8 oz (115.577 kg)  10/24/15 259 lb 9.6 oz (117.754 kg)  10/20/15 260 lb 3.2 oz (118.026 kg)    Physical Exam  Constitutional: He is oriented to person, place, and time. He appears well-developed and well-nourished. No distress.  HENT:  Head: Normocephalic and atraumatic.  Eyes: Conjunctivae and lids are normal. Right eye exhibits no discharge. Left eye exhibits no discharge. No scleral icterus.  Neck: Normal range of motion. Neck supple. No JVD present. Carotid bruit is not present.  Cardiovascular: Normal rate, regular rhythm and normal heart sounds.   Pulmonary/Chest: Effort normal and breath sounds normal. No respiratory distress.  Abdominal: Normal appearance. There is no splenomegaly or hepatomegaly.  Musculoskeletal: Normal range of motion.  Neurological: He is alert and oriented to person, place, and time.  Skin: Skin is warm, dry and intact. No rash  noted. No pallor.  Psychiatric: He has a normal mood and affect. His behavior is normal. Judgment and thought content normal.    Results for orders placed or performed in visit on 10/24/15  Glucose Hemocue Waived  Result Value Ref Range   Glu Hemocue Waived 372 (H) 65 - 99 mg/dL  Bayer DCA Hb W0JA1c Waived  Result Value Ref Range   Bayer DCA Hb A1c Waived >14.0 (H) <7.0 %  Microalbumin, Urine Waived  Result Value Ref Range   Microalb, Ur Waived 10 0 - 19 mg/L   Creatinine, Urine Waived 50 10 - 300 mg/dL   Microalb/Creat Ratio <30 <30 mg/g  UA/M w/rflx Culture, Routine  Result Value Ref Range   Specific Gravity, UA 1.010 1.005 - 1.030   pH, UA 7.0 5.0 - 7.5   Color, UA Yellow Yellow   Appearance Ur Clear Clear   Leukocytes, UA Negative Negative   Protein, UA Negative Negative/Trace   Glucose, UA 3+ (A) Negative   Ketones, UA 1+ (A) Negative   RBC, UA Negative Negative   Bilirubin, UA Negative Negative   Urobilinogen, Ur 0.2 0.2 - 1.0 mg/dL   Nitrite, UA Negative Negative      Assessment & Plan:   Problem List Items Addressed This Visit      Unprioritized   Poorly controlled type 2 diabetes mellitus (HCC) - Primary    Pt ed on  how to take insulin.  He wasn't using needles.  Written instructions to start with 20 units of insulin and titrate to AM blood sugars.            Follow up plan: Return in about 1 week (around 11/05/2015).
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
## 2015-10-29 NOTE — Assessment & Plan Note (Signed)
Pt ed on how to take insulin.  He wasn't using needles.  Written instructions to start with 20 units of insulin and titrate to AM blood sugars.

## 2015-11-05 ENCOUNTER — Ambulatory Visit (INDEPENDENT_AMBULATORY_CARE_PROVIDER_SITE_OTHER): Payer: Medicaid Other | Admitting: Unknown Physician Specialty

## 2015-11-05 ENCOUNTER — Encounter: Payer: Self-pay | Admitting: Unknown Physician Specialty

## 2015-11-05 VITALS — BP 113/73 | HR 62 | Temp 98.0°F | Wt 257.0 lb

## 2015-11-05 DIAGNOSIS — D72829 Elevated white blood cell count, unspecified: Secondary | ICD-10-CM | POA: Diagnosis not present

## 2015-11-05 DIAGNOSIS — E1165 Type 2 diabetes mellitus with hyperglycemia: Secondary | ICD-10-CM | POA: Diagnosis not present

## 2015-11-05 LAB — CBC WITH DIFFERENTIAL/PLATELET
HEMATOCRIT: 43.9 % (ref 37.5–51.0)
Hemoglobin: 16.2 g/dL (ref 12.6–17.7)
LYMPHS: 30 %
Lymphocytes Absolute: 2.8 10*3/uL (ref 0.7–3.1)
MCH: 34.5 pg — ABNORMAL HIGH (ref 26.6–33.0)
MCHC: 35.2 g/dL (ref 31.5–35.7)
MCV: 93 fL (ref 79–97)
MID (ABSOLUTE): 0.9 10*3/uL (ref 0.1–1.6)
MID: 10 %
Neutrophils Absolute: 5.7 10*3/uL (ref 1.4–7.0)
Neutrophils: 60 %
Platelets: 239 10*3/uL (ref 150–379)
RBC: 4.7 x10E6/uL (ref 4.14–5.80)
RDW: 12.4 % (ref 12.3–15.4)
WBC: 9.4 10*3/uL (ref 3.4–10.8)

## 2015-11-05 LAB — GLUCOSE HEMOCUE WAIVED: GLU HEMOCUE WAIVED: 264 mg/dL — AB (ref 65–99)

## 2015-11-05 NOTE — Patient Instructions (Signed)
Start at 20 units of insulin.  Base your long acting insulin on your fasting (usually in the morning) blood sugar.  Increase long acting (daily insulin) 2 units if fasting blood sugar is greater than 120.  Do this nightly.

## 2015-11-05 NOTE — Assessment & Plan Note (Signed)
Increase insulin to 20 units.  Written instruction given to titrate 2 units daily.

## 2015-11-05 NOTE — Progress Notes (Signed)
   BP 113/73 mmHg  Pulse 62  Temp(Src) 98 F (36.7 C)  Wt 257 lb (116.574 kg)  SpO2 98%   Subjective:    Patient ID: Kevin Meza, male    DOB: 05/09/1986, 30 y.o.   MRN: [id number]  HPI: Kevin Meza is a 30 y.o. male  Chief Complaint  Patient presents with  . Diabetes    recheck   Pt states he is still on 10 units of insulin despite blood sugars running 300's in the AM.    Relevant past medical, surgical, family and social history reviewed and updated as indicated. Interim medical history since our last visit reviewed. Allergies and medications reviewed and updated.  Review of Systems  Per HPI unless specifically indicated above     Objective:    BP 113/73 mmHg  Pulse 62  Temp(Src) 98 F (36.7 C)  Wt 257 lb (116.574 kg)  SpO2 98%  Wt Readings from Last 3 Encounters:  11/05/15 257 lb (116.574 kg)  10/29/15 256 lb 9.6 oz (116.393 kg)  10/29/15 254 lb 12.8 oz (115.577 kg)    Physical Exam  Constitutional: He is oriented to person, place, and time. He appears well-developed and well-nourished. No distress.  HENT:  Head: Normocephalic and atraumatic.  Eyes: Conjunctivae and lids are normal. Right eye exhibits no discharge. Left eye exhibits no discharge. No scleral icterus.  Cardiovascular: Normal rate.   Pulmonary/Chest: Effort normal.  Abdominal: Normal appearance. There is no splenomegaly or hepatomegaly.  Musculoskeletal: Normal range of motion.  Neurological: He is alert and oriented to person, place, and time.  Skin: Skin is intact. No rash noted. No pallor.  Psychiatric: He has a normal mood and affect. His behavior is normal. Judgment and thought content normal.    Results for orders placed or performed in visit on 10/24/15  Glucose Hemocue Waived  Result Value Ref Range   Glu Hemocue Waived 372 (H) 65 - 99 mg/dL  Bayer DCA Hb W0JA1c Waived  Result Value Ref Range   Bayer DCA Hb A1c Waived >14.0 (H) <7.0 %  Microalbumin, Urine Waived  Result Value  Ref Range   Microalb, Ur Waived 10 0 - 19 mg/L   Creatinine, Urine Waived 50 10 - 300 mg/dL   Microalb/Creat Ratio <30 <30 mg/g  UA/M w/rflx Culture, Routine  Result Value Ref Range   Specific Gravity, UA 1.010 1.005 - 1.030   pH, UA 7.0 5.0 - 7.5   Color, UA Yellow Yellow   Appearance Ur Clear Clear   Leukocytes, UA Negative Negative   Protein, UA Negative Negative/Trace   Glucose, UA 3+ (A) Negative   Ketones, UA 1+ (A) Negative   RBC, UA Negative Negative   Bilirubin, UA Negative Negative   Urobilinogen, Ur 0.2 0.2 - 1.0 mg/dL   Nitrite, UA Negative Negative      Assessment & Plan:   Problem List Items Addressed This Visit      Unprioritized   Elevated WBC count   Poorly controlled type 2 diabetes mellitus (HCC) - Primary    Increase insulin to 20 units.  Written instruction given to titrate 2 units daily.           Check c peptide and consider endocrine referral  Follow up plan: Return in about 1 week (around 11/12/2015).

## 2015-11-06 LAB — C-PEPTIDE: C-Peptide: 3.1 ng/mL (ref 1.1–4.4)

## 2015-11-11 ENCOUNTER — Encounter: Payer: Medicaid Other | Attending: Unknown Physician Specialty | Admitting: *Deleted

## 2015-11-11 ENCOUNTER — Encounter: Payer: Self-pay | Admitting: *Deleted

## 2015-11-11 VITALS — BP 110/68 | Ht 72.0 in | Wt 260.7 lb

## 2015-11-11 DIAGNOSIS — E119 Type 2 diabetes mellitus without complications: Secondary | ICD-10-CM | POA: Insufficient documentation

## 2015-11-11 DIAGNOSIS — Z794 Long term (current) use of insulin: Secondary | ICD-10-CM

## 2015-11-11 NOTE — Patient Instructions (Addendum)
Check blood sugars 2 x day before breakfast and 2 hrs after supper every day Exercise: Begin walking for   15  minutes  3 days a week and gradually increase to 150 minutes/week - nothing strenuous until blood sugars less than 250 Eat 3 meals day,  1-2  snacks a day Space meals 4-6 hours apart Drink plenty of water Make an eye doctor appointment Bring blood sugar records to the next class Carry fast acting glucose and a snack at all times Rotate injection sites Leave insulin pen in place for 5-10 seconds after injection

## 2015-11-11 NOTE — Progress Notes (Signed)
Diabetes Self-Management Education  Visit Type: First/Initial  Appt. Start Time: 1100 Appt. End Time: 1210  11/11/2015  Kevin Meza, identified by name and date of birth, is a 30 y.o. male with a diagnosis of Diabetes: Type 2.   ASSESSMENT  Blood pressure 110/68, height 6' (1.829 m), weight 260 lb 11.2 oz (118.253 kg). Body mass index is 35.35 kg/(m^2).      Diabetes Self-Management Education - 11/11/15 1223    Visit Information   Visit Type First/Initial   Initial Visit   Diabetes Type Type 2   Are you currently following a meal plan? No   Are you taking your medications as prescribed? No  He has missed his insulin for 3 days because he didn't have pen needles. Provided 4 packs  (20 total ) of BD pen needles.    Date Diagnosed 2 weeks ago   Health Coping   How would you rate your overall health? Fair   Psychosocial Assessment   Patient Belief/Attitude about Diabetes Afraid  "terrible, sad, anxious"   Self-care barriers Other (comment)  finances   Self-management support Doctor's office;Family   Other persons present Parent   Patient Concerns Nutrition/Meal planning;Weight Control;Glycemic Control   Special Needs None   Preferred Learning Style Hands on   Learning Readiness Change in progress   How often do you need to have someone help you when you read instructions, pamphlets, or other written materials from your doctor or pharmacy? 1 - Never   What is the last grade level you completed in school? 14 years (college)   Pre-Education Assessment   Patient understands the diabetes disease and treatment process. Needs Instruction   Patient understands incorporating nutritional management into lifestyle. Needs Instruction   Patient undertands incorporating physical activity into lifestyle. Needs Instruction   Patient understands using medications safely. Needs Instruction   Patient understands monitoring blood glucose, interpreting and using results Needs Review   Patient understands prevention, detection, and treatment of acute complications. Needs Instruction   Patient understands prevention, detection, and treatment of chronic complications. Needs Instruction   Patient understands how to develop strategies to address psychosocial issues. Needs Instruction   Patient understands how to develop strategies to promote health/change behavior. Needs Instruction   Complications   Last HgB A1C per patient/outside source 14 %   How often do you check your blood sugar? 1-2 times/day  Pt doesn't check BG on regular basis. Reading yesterday in the afternoon was 339 mg/dL.    Have you had a dilated eye exam in the past 12 months? No   Have you had a dental exam in the past 12 months? No   Are you checking your feet? No   Dietary Intake   Breakfast egg, sausage, egg noodles and cheese; cereal and milk; rarely oatmeal   Lunch sandwich with peanut butter, chicken or [place]   Warehouse manager and vegetables; rice and pasta   Beverage(s) water, unsweetened coffee and tea   Exercise   Exercise Type ADL's   Patient Education   Previous Diabetes Education No   Disease state  Definition of diabetes, type 1 and 2, and the diagnosis of diabetes   Nutrition management  Role of diet in the treatment of diabetes and the relationship between the three main macronutrients and blood glucose level   Physical activity and exercise  Role of exercise on diabetes management, blood pressure control and cardiac health.   Medications Taught/reviewed insulin injection, site rotation, insulin storage and needle disposal.;Reviewed  patients medication for diabetes, action, purpose, timing of dose and side effects.   Monitoring Purpose and frequency of SMBG.;Identified appropriate SMBG and/or A1C goals.   Acute complications Taught treatment of hypoglycemia - the 15 rule.   Chronic complications Relationship between chronic complications and blood glucose control;Retinopathy and reason for  yearly dilated eye exams   Psychosocial adjustment Identified and addressed patients feelings and concerns about diabetes   Personal strategies to promote health Review risk of smoking and offered smoking cessation   Individualized Goals (developed by patient)   Reducing Risk Improve blood sugars Lose weight Quit smoking   Outcomes   Expected Outcomes Demonstrated interest in learning. Expect positive outcomes   Future DMSE 2 wks      Individualized Plan for Diabetes Self-Management Training:   Learning Objective:  Patient will have a greater understanding of diabetes self-management. Patient education plan is to attend individual and/or group sessions per assessed needs and concerns.   Plan:   Patient Instructions  Check blood sugars 2 x day before breakfast and 2 hrs after supper every day Exercise: Begin walking for   15  minutes  3 days a week and gradually increase to 150 minutes/week - nothing strenuous until blood sugars less than 250 Eat 3 meals day,  1-2  snacks a day Space meals 4-6 hours apart Drink plenty of water Make an eye doctor appointment Bring blood sugar records to the next class Carry fast acting glucose and a snack at all times Rotate injection sites Leave insulin pen in place for 5-10 seconds after injection   Expected Outcomes:  Demonstrated interest in learning. Expect positive outcomes  Education material provided:  General Meal Planning Guidelines Simple Meal Plan Glucose tablets Symptoms, causes and treatments of Hypoglycemia Site rotation (BD) Using Insulin pens and pen needles (BD) 4 mm Nano pen needles (20)  If problems or questions, patient to contact team via:  Sharion SettlerSheila Naomi Castrogiovanni, RN, CCM, CDE [phone]  Future DSME appointment: 2 wks  November 24, 2015 for Diabetes Class 1

## 2015-11-12 ENCOUNTER — Ambulatory Visit: Payer: Medicaid Other | Admitting: Unknown Physician Specialty

## 2015-11-16 ENCOUNTER — Other Ambulatory Visit: Payer: Self-pay | Admitting: Unknown Physician Specialty

## 2015-11-17 ENCOUNTER — Ambulatory Visit (INDEPENDENT_AMBULATORY_CARE_PROVIDER_SITE_OTHER): Payer: Medicaid Other | Admitting: Unknown Physician Specialty

## 2015-11-17 ENCOUNTER — Encounter: Payer: Self-pay | Admitting: Unknown Physician Specialty

## 2015-11-17 VITALS — BP 127/77 | HR 52 | Temp 98.2°F | Ht 71.5 in | Wt 225.6 lb

## 2015-11-17 DIAGNOSIS — E7800 Pure hypercholesterolemia, unspecified: Secondary | ICD-10-CM | POA: Diagnosis not present

## 2015-11-17 DIAGNOSIS — E1165 Type 2 diabetes mellitus with hyperglycemia: Secondary | ICD-10-CM

## 2015-11-17 LAB — LIPID PANEL PICCOLO, WAIVED
CHOL/HDL RATIO PICCOLO,WAIVE: 6.5 mg/dL — AB
Cholesterol Piccolo, Waived: 238 mg/dL — ABNORMAL HIGH (ref <200)
HDL Chol Piccolo, Waived: 36 mg/dL — ABNORMAL LOW (ref >59)
LDL CHOL CALC PICCOLO WAIVED: 134 mg/dL — AB (ref <100)
Triglycerides Piccolo,Waived: 341 mg/dL — ABNORMAL HIGH (ref <150)
VLDL Chol Calc Piccolo,Waive: 68 mg/dL — ABNORMAL HIGH (ref <30)

## 2015-11-17 NOTE — Assessment & Plan Note (Signed)
LDL is 136.  Pt will take a 81 mg ASA and is thinking about agreeing to statins.

## 2015-11-17 NOTE — Assessment & Plan Note (Signed)
Encouraged to titrate his insulin.  It is improving and pt needs to gain confidence that it is OK to increase dose.

## 2015-11-17 NOTE — Progress Notes (Signed)
BP 127/77 mmHg  Pulse 52  Temp(Src) 98.2 F (36.8 C)  Ht 5' 11.5" (1.816 m)  Wt 225 lb 9.6 oz (102.331 kg)  BMI 31.03 kg/m2  SpO2 97%   Subjective:    Patient ID: Kevin Meza, male    DOB: Nov 22, 1985, 30 y.o.   MRN: [id number]  HPI: Kevin PlowmanMatthew Woodhead is a 30 y.o. male  Chief Complaint  Patient presents with  . Diabetes    recheck/follow-up   Diabetes Pt states his blood sugar is running 140-180.  Taking 24 units of [place]iba.  [facility]    Relevant past medical, surgical, family and social history reviewed and updated as indicated. Interim medical history since our last visit reviewed. Allergies and medications reviewed and updated.  Review of Systems  Per HPI unless specifically indicated above     Objective:    BP 127/77 mmHg  Pulse 52  Temp(Src) 98.2 F (36.8 C)  Ht 5' 11.5" (1.816 m)  Wt 225 lb 9.6 oz (102.331 kg)  BMI 31.03 kg/m2  SpO2 97%  Wt Readings from Last 3 Encounters:  11/17/15 225 lb 9.6 oz (102.331 kg)  11/11/15 260 lb 11.2 oz (118.253 kg)  11/05/15 257 lb (116.574 kg)    Physical Exam  Constitutional: He is oriented to person, place, and time. He appears well-developed and well-nourished. No distress.  HENT:  Head: Normocephalic and atraumatic.  Eyes: Conjunctivae and lids are normal. Right eye exhibits no discharge. Left eye exhibits no discharge. No scleral icterus.  Neck: Normal range of motion. Neck supple. No JVD present. Carotid bruit is not present.  Cardiovascular: Normal rate, regular rhythm and normal heart sounds.   Pulmonary/Chest: Effort normal and breath sounds normal. No respiratory distress.  Abdominal: Normal appearance. There is no splenomegaly or hepatomegaly.  Musculoskeletal: Normal range of motion.  Neurological: He is alert and oriented to person, place, and time.  Skin: Skin is warm, dry and intact. No rash noted. No pallor.  Psychiatric: He has a normal mood and affect. His behavior is  normal. Judgment and thought content normal.    Results for orders placed or performed in visit on 11/05/15  Glucose Hemocue Waived  Result Value Ref Range   Glu Hemocue Waived 264 (H) 65 - 99 mg/dL  C-peptide  Result Value Ref Range   C-Peptide 3.1 1.1 - 4.4 ng/mL  CBC With Differential/Platelet  Result Value Ref Range   WBC 9.4 3.4 - 10.8 x10E3/uL   RBC 4.70 4.14 - 5.80 x10E6/uL   Hemoglobin 16.2 12.6 - 17.7 g/dL   Hematocrit 78.243.9 95.637.5 - 51.0 %   MCV 93 79 - 97 fL   MCH 34.5 (H) 26.6 - 33.0 pg   MCHC 35.2 31.5 - 35.7 g/dL   RDW 21.312.4 08.612.3 - 57.815.4 %   Platelets 239 150 - 379 x10E3/uL   Neutrophils 60 %   Lymphs 30 %   MID 10 %   Neutrophils Absolute 5.7 1.4 - 7.0 x10E3/uL   Lymphocytes Absolute 2.8 0.7 - 3.1 x10E3/uL   MID (Absolute) 0.9 0.1 - 1.6 X10E3/uL      Assessment & Plan:   Problem List Items Addressed This Visit      Unprioritized   Hypercholesteremia    LDL is 136.  Pt will take a 81 mg ASA and is thinking about agreeing to statins.        Poorly controlled type 2 diabetes mellitus (HCC) - Primary    Encouraged  to titrate his insulin.  It is improving and pt needs to gain confidence that it is OK to increase dose.        Relevant Orders   Lipid Panel Piccolo, Waived       Follow up plan: Return in about 4 weeks (around 12/15/2015).

## 2015-11-19 ENCOUNTER — Other Ambulatory Visit: Payer: Self-pay | Admitting: Unknown Physician Specialty

## 2015-11-19 MED ORDER — INSULIN DEGLUDEC 100 UNIT/ML ~~LOC~~ SOPN: 24.0000 [IU] | PEN_INJECTOR | Freq: Two times a day (BID) | SUBCUTANEOUS | Status: DC

## 2015-11-19 NOTE — Telephone Encounter (Signed)
Pt needs a refill for Insulin Degludec (TRESIBA FLEXTOUCH) 100 UNIT/ML SOPN and Insulin Pen Needle 31G X 6 MM MISC [facility].

## 2015-11-19 NOTE — Telephone Encounter (Signed)
Called pharmacy because according to chart, patient should still have refills for needles. Pharmacy stated that patient still has 12 refills for needles. So the only rx that needs refilling is the tresiba. [facility] [address]

## 2015-11-24 ENCOUNTER — Ambulatory Visit: Payer: Medicaid Other

## 2015-11-24 ENCOUNTER — Telehealth: Payer: Self-pay

## 2015-11-24 MED ORDER — INSULIN DEGLUDEC 200 UNIT/ML ~~LOC~~ SOPN: 60.0000 [IU] | PEN_INJECTOR | Freq: Every day | SUBCUTANEOUS | Status: DC

## 2015-11-24 NOTE — Telephone Encounter (Signed)
Patient was prescribed Tresiba 100 units/ml pen. Which requires Prior Authorization.   Tresiba 200 units/ml in a 9ml package does not require Prior Authorization and it's a 34 day supply.  Patient was given 1 package already part of the Pharmacy Action Plan.

## 2015-11-25 ENCOUNTER — Ambulatory Visit: Payer: Medicaid Other | Admitting: Psychiatry

## 2015-12-15 ENCOUNTER — Ambulatory Visit: Payer: Medicaid Other | Admitting: Unknown Physician Specialty

## 2015-12-15 ENCOUNTER — Ambulatory Visit: Payer: Medicaid Other

## 2015-12-24 ENCOUNTER — Ambulatory Visit: Payer: Medicaid Other | Admitting: Psychiatry

## 2015-12-29 ENCOUNTER — Ambulatory Visit (INDEPENDENT_AMBULATORY_CARE_PROVIDER_SITE_OTHER): Payer: Medicaid Other | Admitting: Psychiatry

## 2015-12-29 ENCOUNTER — Encounter: Payer: Self-pay | Admitting: Psychiatry

## 2015-12-29 ENCOUNTER — Ambulatory Visit: Payer: Medicaid Other

## 2015-12-29 VITALS — BP 146/82 | HR 61 | Temp 97.7°F | Ht 71.5 in | Wt 266.0 lb

## 2015-12-29 DIAGNOSIS — F3130 Bipolar disorder, current episode depressed, mild or moderate severity, unspecified: Secondary | ICD-10-CM | POA: Diagnosis not present

## 2015-12-29 MED ORDER — DIVALPROEX SODIUM 250 MG PO DR TAB: 750.0000 mg | DELAYED_RELEASE_TABLET | Freq: Every evening | ORAL | 0 refills | Status: DC

## 2015-12-29 MED ORDER — ESCITALOPRAM OXALATE 10 MG PO TABS: 10.0000 mg | ORAL_TABLET | Freq: Every day | ORAL | 0 refills | Status: DC

## 2015-12-29 MED ORDER — LURASIDONE HCL 40 MG PO TABS: 40.0000 mg | ORAL_TABLET | Freq: Every day | ORAL | 1 refills | Status: DC

## 2015-12-29 MED ORDER — TRAZODONE HCL 100 MG PO TABS: 200.0000 mg | ORAL_TABLET | Freq: Every day | ORAL | 0 refills | Status: DC
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
## 2016-01-05 ENCOUNTER — Ambulatory Visit: Payer: Medicaid Other

## 2016-01-08 ENCOUNTER — Encounter: Payer: Self-pay | Admitting: *Deleted

## 2016-01-12 ENCOUNTER — Ambulatory Visit: Payer: Medicaid Other

## 2016-01-13 ENCOUNTER — Ambulatory Visit: Payer: Self-pay | Admitting: Unknown Physician Specialty

## 2016-01-19 ENCOUNTER — Ambulatory Visit: Payer: Self-pay | Admitting: Unknown Physician Specialty

## 2016-01-29 ENCOUNTER — Ambulatory Visit: Payer: Self-pay | Admitting: Psychiatry

## 2016-02-12 ENCOUNTER — Telehealth: Payer: Self-pay

## 2016-02-12 NOTE — Telephone Encounter (Signed)
called in 5 tablets of the lexapro.

## 2016-02-12 NOTE — Telephone Encounter (Signed)
pt called stated that he lost his paper that was given to him and he forgot the medication that Dr. Garnetta BuddyFaheem gave him.  pt states he out and needs refill.

## 2016-02-16 ENCOUNTER — Ambulatory Visit: Payer: Medicaid Other | Admitting: Psychiatry

## 2016-02-27 ENCOUNTER — Ambulatory Visit: Payer: Self-pay | Admitting: Unknown Physician Specialty

## 2016-03-14 HISTORY — PX: WISDOM TOOTH EXTRACTION: SHX21

## 2016-04-13 ENCOUNTER — Encounter: Payer: Self-pay | Admitting: Unknown Physician Specialty

## 2016-04-13 ENCOUNTER — Ambulatory Visit (INDEPENDENT_AMBULATORY_CARE_PROVIDER_SITE_OTHER): Payer: Medicaid Other | Admitting: Unknown Physician Specialty

## 2016-04-13 VITALS — BP 130/94 | HR 67 | Temp 97.6°F | Ht 72.7 in | Wt 276.8 lb

## 2016-04-13 DIAGNOSIS — E1165 Type 2 diabetes mellitus with hyperglycemia: Secondary | ICD-10-CM | POA: Diagnosis not present

## 2016-04-13 DIAGNOSIS — J00 Acute nasopharyngitis [common cold]: Secondary | ICD-10-CM

## 2016-04-13 MED ORDER — AZITHROMYCIN 250 MG PO TABS: ORAL_TABLET | ORAL | 0 refills | Status: DC

## 2016-04-13 NOTE — Progress Notes (Signed)
BP (!) 130/94 (BP Location: Left Arm, Cuff Size: Large)   Pulse 67   Temp 97.6 F (36.4 C)   Ht 6' 0.7" (1.847 m)   Wt 276 lb 12.8 oz (125.6 kg)   SpO2 94%   BMI 36.82 kg/m    Subjective:    Patient ID: Kevin Meza, male    DOB: 05/24/1986, 30 y.o.   MRN: [id number]  HPI: Kevin Meza is a 30 y.o. male  Chief Complaint  Patient presents with  . URI    pt states he has been coughing, had sinus congestion, sinus pressure, and headache. States symptoms started about 4 to 5 days ago.    URI   This is a new problem. The current episode started in the past 7 days. The problem has been gradually worsening. There has been no fever. Associated symptoms include congestion, coughing, sneezing and a sore throat. He has tried antihistamine and decongestant for the symptoms. The treatment provided no relief.   Lost to f/u for his Diabetes f/u.  He is "hit or miss" with his insulin.  He is taking 32 units and not over 200 when it is checked.  He is gaining weight.  He is [place]-Bissauresiba, and "he hates it."  States it causes "an egg" on his stomach and burns  Relevant past medical, surgical, family and social history reviewed and updated as indicated. Interim medical history since our last visit reviewed. Allergies and medications reviewed and updated.  Review of Systems  HENT: Positive for congestion, sneezing and sore throat.   Respiratory: Positive for cough.     Per HPI unless specifically indicated above     Objective:    BP (!) 130/94 (BP Location: Left Arm, Cuff Size: Large)   Pulse 67   Temp 97.6 F (36.4 C)   Ht 6' 0.7" (1.847 m)   Wt 276 lb 12.8 oz (125.6 kg)   SpO2 94%   BMI 36.82 kg/m   Wt Readings from Last 3 Encounters:  04/13/16 276 lb 12.8 oz (125.6 kg)  12/29/15 266 lb (120.7 kg)  11/17/15 225 lb 9.6 oz (102.3 kg)    Physical Exam  Constitutional: He is oriented to person, place, and time. He appears well-developed and well-nourished. No distress.  HENT:    Head: Normocephalic and atraumatic.  Right Ear: Tympanic membrane and ear canal normal.  Left Ear: Tympanic membrane and ear canal normal.  Nose: Rhinorrhea present. Right sinus exhibits no maxillary sinus tenderness and no frontal sinus tenderness. Left sinus exhibits no maxillary sinus tenderness and no frontal sinus tenderness.  Mouth/Throat: Uvula is midline. Posterior oropharyngeal edema present.  Eyes: Conjunctivae and lids are normal. Right eye exhibits no discharge. Left eye exhibits no discharge. No scleral icterus.  Neck: Neck supple.  Cardiovascular: Normal rate, regular rhythm and normal heart sounds.   Pulmonary/Chest: Effort normal and breath sounds normal. No respiratory distress.  Abdominal: Normal appearance. There is no splenomegaly or hepatomegaly.  Musculoskeletal: Normal range of motion.  Neurological: He is alert and oriented to person, place, and time.  Skin: Skin is warm, dry and intact. No rash noted. No pallor.  Psychiatric: He has a normal mood and affect. His behavior is normal. Judgment and thought content normal.  Nursing note and vitals reviewed.   Results for orders placed or performed in visit on 11/17/15  Lipid Panel Piccolo, Arrow ElectronicsWaived  Result Value Ref Range   Cholesterol Piccolo, Waived 238 (H) <200 mg/dL   HDL Chol SpeedwayPiccolo, EitzenWaived  36 (L) >59 mg/dL   Triglycerides Piccolo,Waived 341 (H) <150 mg/dL   Chol/HDL Ratio Piccolo,Waive 6.5 (H) mg/dL   LDL Chol Calc Piccolo Waived 134 (H) <100 mg/dL   VLDL Chol Calc Piccolo,Waive 68 (H) <30 mg/dL      Assessment & Plan:   Problem List Items Addressed This Visit      Unprioritized   Poorly controlled type 2 diabetes mellitus ([facility])    Pt is not consistently taking insulin and therefore will not check today.  He is not taking Guinea-Bissauresiba due to side-effects. Sample of Tresiba given and 32 u given in the office       Other Visit Diagnoses    Acute nasopharyngitis    -  Primary   Relevant Medications    azithromycin (ZITHROMAX Z-PAK) 250 MG tablet       Follow up plan: Return for chronic disease f/u.

## 2016-04-13 NOTE — Assessment & Plan Note (Signed)
Pt is not consistently taking insulin and therefore will not check today.  He is not taking Guinea-Bissauresiba due to side-effects. Sample of Tresiba given and 32 u given in the office

## 2016-05-03 ENCOUNTER — Ambulatory Visit: Payer: Medicaid Other | Admitting: Psychiatry

## 2016-05-05 ENCOUNTER — Ambulatory Visit (INDEPENDENT_AMBULATORY_CARE_PROVIDER_SITE_OTHER): Payer: Medicaid Other | Admitting: Psychiatry

## 2016-05-05 ENCOUNTER — Encounter: Payer: Self-pay | Admitting: Psychiatry

## 2016-05-05 VITALS — BP 141/85 | HR 80 | Temp 98.0°F | Wt 278.6 lb

## 2016-05-05 DIAGNOSIS — F3130 Bipolar disorder, current episode depressed, mild or moderate severity, unspecified: Secondary | ICD-10-CM | POA: Diagnosis not present

## 2016-05-05 MED ORDER — TRAZODONE HCL 100 MG PO TABS: 100.0000 mg | ORAL_TABLET | Freq: Every day | ORAL | 1 refills | Status: DC

## 2016-05-05 MED ORDER — LURASIDONE HCL 40 MG PO TABS: 40.0000 mg | ORAL_TABLET | Freq: Every day | ORAL | 1 refills | Status: DC

## 2016-05-05 MED ORDER — ESCITALOPRAM OXALATE 10 MG PO TABS: 10.0000 mg | ORAL_TABLET | Freq: Every day | ORAL | 1 refills | Status: DC

## 2016-05-05 MED ORDER — DIVALPROEX SODIUM 250 MG PO DR TAB: 750.0000 mg | DELAYED_RELEASE_TABLET | Freq: Every evening | ORAL | 1 refills | Status: DC
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
## 2016-05-13 ENCOUNTER — Ambulatory Visit (INDEPENDENT_AMBULATORY_CARE_PROVIDER_SITE_OTHER): Payer: Medicaid Other | Admitting: Unknown Physician Specialty

## 2016-05-13 ENCOUNTER — Encounter: Payer: Self-pay | Admitting: Unknown Physician Specialty

## 2016-05-13 VITALS — BP 114/72 | HR 67 | Temp 97.7°F | Ht 73.2 in | Wt 282.2 lb

## 2016-05-13 DIAGNOSIS — E119 Type 2 diabetes mellitus without complications: Secondary | ICD-10-CM | POA: Diagnosis not present

## 2016-05-13 DIAGNOSIS — Z23 Encounter for immunization: Secondary | ICD-10-CM | POA: Diagnosis not present

## 2016-05-13 DIAGNOSIS — Z794 Long term (current) use of insulin: Secondary | ICD-10-CM

## 2016-05-13 MED ORDER — INSULIN DEGLUDEC 200 UNIT/ML ~~LOC~~ SOPN: 60.0000 [IU] | PEN_INJECTOR | Freq: Every day | SUBCUTANEOUS | 12 refills | Status: AC

## 2016-05-13 NOTE — Progress Notes (Signed)
BP 114/72 (BP Location: Left Arm, Cuff Size: Large)   Pulse 67   Temp 97.7 F (36.5 C)   Ht 6' 1.2" (1.859 m)   Wt 282 lb 3.2 oz (128 kg)   SpO2 96%   BMI 37.03 kg/m    Subjective:    Patient ID: Kevin Meza, male    DOB: 1985-06-14, 30 y.o.   MRN: [id number]  HPI: Kevin Meza is a 30 y.o. male  Chief Complaint  Patient presents with  . Diabetes   Diabetes Pt was taking the [place]iba and [facility] as he was complaining of burning and pain.  He is taking 32 units daily and has noted the same pain as [place]-Bissauresiba and insurance will cover the [place]-Bissauresiba.  AM blood sugar is 140-150.  Both he and his mom are both on food stamps.  They will work on more vegetables and decreasing bread.  He has an active dog and is walking.  He is planning on moving temporarily for now but maybe permanently to [place] to work with his step-brother.  He is planning to eat healthy and exercising.    Relevant past medical, surgical, family and social history reviewed and updated as indicated. Interim medical history since our last visit reviewed. Allergies and medications reviewed and updated.  Review of Systems  Per HPI unless specifically indicated above     Objective:    BP 114/72 (BP Location: Left Arm, Cuff Size: Large)   Pulse 67   Temp 97.7 F (36.5 C)   Ht 6' 1.2" (1.859 m)   Wt 282 lb 3.2 oz (128 kg)   SpO2 96%   BMI 37.03 kg/m   Wt Readings from Last 3 Encounters:  05/13/16 282 lb 3.2 oz (128 kg)  04/13/16 276 lb 12.8 oz (125.6 kg)  11/17/15 225 lb 9.6 oz (102.3 kg)    Physical Exam  Constitutional: He is oriented to person, place, and time. He appears well-developed and well-nourished. No distress.  HENT:  Head: Normocephalic and atraumatic.  Eyes: Conjunctivae and lids are normal. Right eye exhibits no discharge. Left eye exhibits no discharge. No scleral icterus.  Cardiovascular: Normal rate.   Pulmonary/Chest: Effort normal.  Abdominal: Normal appearance. There is no  splenomegaly or hepatomegaly.  Musculoskeletal: Normal range of motion.  Neurological: He is alert and oriented to person, place, and time.  Skin: Skin is intact. No rash noted. No pallor.  Psychiatric: He has a normal mood and affect. His behavior is normal. Judgment and thought content normal.    Results for orders placed or performed in visit on 11/17/15  Lipid Panel Piccolo, [employer] ElectronicsWaived  Result Value Ref Range   Cholesterol Piccolo, Waived 238 (H) <200 mg/dL   HDL Chol Piccolo, Waived 36 (L) >59 mg/dL   Triglycerides Piccolo,Waived 341 (H) <150 mg/dL   Chol/HDL Ratio Piccolo,Waive 6.5 (H) mg/dL   LDL Chol Calc Piccolo Waived 134 (H) <100 mg/dL   VLDL Chol Calc Piccolo,Waive 68 (H) <30 mg/dL      Assessment & Plan:   Problem List Items Addressed This Visit    None    Visit Diagnoses    Need for influenza vaccination    -  Primary   Relevant Orders   Flu Vaccine QUAD 36+ mos PF IM (Fluarix & Fluzone Quad PF) (Completed)   Need for pneumococcal vaccination       Relevant Orders   Pneumococcal polysaccharide vaccine 23-valent greater than or equal to 2yo subcutaneous/IM (Completed)  Type 2 diabetes mellitus without complication, with long-term current use of insulin (HCC)       Relevant Medications   Insulin Degludec (TRESIBA FLEXTOUCH) 200 UNIT/ML SOPN         Follow up plan: Return in about 3 weeks (around 06/03/2016) for CMP and Hgb A1C and Depacote levels.

## 2016-05-13 NOTE — Patient Instructions (Addendum)
Influenza (Flu) Vaccine (Inactivated or Recombinant): What You Need to Know 1. Why get vaccinated? Influenza ("flu") is a contagious disease that spreads around the [place] every year, usually between October and May. Flu is caused by influenza viruses, and is spread mainly by coughing, sneezing, and close contact. Anyone can get flu. Flu strikes suddenly and can last several days. Symptoms vary by age, but can include:  fever/chills  sore throat  muscle aches  fatigue  cough  headache  runny or stuffy nose Flu can also lead to pneumonia and blood infections, and cause diarrhea and seizures in children. If you have a medical condition, such as heart or lung disease, flu can make it worse. Flu is more dangerous for some people. Infants and young children, people 65 years of age and older, pregnant women, and people with certain health conditions or a weakened immune system are at greatest risk. Each year thousands of people in the [place] die from flu, and many more are hospitalized. Flu vaccine can:  keep you from getting flu,  make flu less severe if you do get it, and  keep you from spreading flu to your family and other people. 2. Inactivated and recombinant flu vaccines A dose of flu vaccine is recommended every flu season. Children 6 months through 8 years of age may need two doses during the same flu season. Everyone else needs only one dose each flu season. Some inactivated flu vaccines contain a very small amount of a mercury-based preservative called thimerosal. Studies have not shown thimerosal in vaccines to be harmful, but flu vaccines that do not contain thimerosal are available. There is no live flu virus in flu shots. They cannot cause the flu. There are many flu viruses, and they are always changing. Each year a new flu vaccine is made to protect against three or four viruses that are likely to cause disease in the upcoming flu season. But even when the  vaccine doesn't exactly match these viruses, it may still provide some protection. Flu vaccine cannot prevent:  flu that is caused by a virus not covered by the vaccine, or  illnesses that look like flu but are not. It takes about 2 weeks for protection to develop after vaccination, and protection lasts through the flu season. 3. Some people should not get this vaccine Tell the person who is giving you the vaccine:  If you have any severe, life-threatening allergies. If you ever had a life-threatening allergic reaction after a dose of flu vaccine, or have a severe allergy to any part of this vaccine, you may be advised not to get vaccinated. Most, but not all, types of flu vaccine contain a small amount of egg protein.  If you ever had Guillain-Barr Syndrome (also called GBS). Some people with a history of GBS should not get this vaccine. This should be discussed with your doctor.  If you are not feeling well. It is usually okay to get flu vaccine when you have a mild illness, but you might be asked to come back when you feel better. 4. Risks of a vaccine reaction With any medicine, including vaccines, there is a chance of reactions. These are usually mild and go away on their own, but serious reactions are also possible. Most people who get a flu shot do not have any problems with it. Minor problems following a flu shot include:  soreness, redness, or swelling where the shot was given  hoarseness  sore, red or itchy   eyes  cough  fever  aches  headache  itching  fatigue If these problems occur, they usually begin soon after the shot and last 1 or 2 days. More serious problems following a flu shot can include the following:  There may be a small increased risk of Guillain-Barre Syndrome (GBS) after inactivated flu vaccine. This risk has been estimated at 1 or 2 additional cases per million people vaccinated. This is much lower than the risk of severe complications from flu,  which can be prevented by flu vaccine.  Young children who get the flu shot along with pneumococcal vaccine (PCV13) and/or DTaP vaccine at the same time might be slightly more likely to have a seizure caused by fever. Ask your doctor for more information. Tell your doctor if a child who is getting flu vaccine has ever had a seizure. Problems that could happen after any injected vaccine:  People sometimes faint after a medical procedure, including vaccination. Sitting or lying down for about 15 minutes can help prevent fainting, and injuries caused by a fall. Tell your doctor if you feel dizzy, or have vision changes or ringing in the ears.  Some people get severe pain in the shoulder and have difficulty moving the arm where a shot was given. This happens very rarely.  Any medication can cause a severe allergic reaction. Such reactions from a vaccine are very rare, estimated at about 1 in a million doses, and would happen within a few minutes to a few hours after the vaccination. As with any medicine, there is a very remote chance of a vaccine causing a serious injury or death. The safety of vaccines is always being monitored. For more information, visit: www.cdc.gov/vaccinesafety/ 5. What if there is a serious reaction? What should I look for? Look for anything that concerns you, such as signs of a severe allergic reaction, very high fever, or unusual behavior. Signs of a severe allergic reaction can include hives, swelling of the face and throat, difficulty breathing, a fast heartbeat, dizziness, and weakness. These would start a few minutes to a few hours after the vaccination. What should I do?  If you think it is a severe allergic reaction or other emergency that can't wait, call 9-1-1 and get the person to the nearest hospital. Otherwise, call your doctor.  Reactions should be reported to the Vaccine Adverse Event Reporting System (VAERS). Your doctor should file this report, or you can do  it yourself through the VAERS web site at www.vaers.hhs.gov, or by calling 1-800-822-7967.  VAERS does not give medical advice. 6. The National Vaccine Injury Compensation Program The National Vaccine Injury Compensation Program (VICP) is a federal program that was created to compensate people who may have been injured by certain vaccines. Persons who believe they may have been injured by a vaccine can learn about the program and about filing a claim by calling 1-800-338-2382 or visiting the VICP website at www.hrsa.gov/vaccinecompensation. There is a time limit to file a claim for compensation. 7. How can I learn more?  Ask your healthcare provider. He or she can give you the vaccine package insert or suggest other sources of information.  Call your local or state health department.  Contact the Centers for Disease Control and Prevention (CDC):  Call 1-800-232-4636 (1-800-CDC-INFO) or  Visit CDC's website at www.cdc.gov/flu Vaccine Information Statement, Inactivated Influenza Vaccine (01/04/2014) This information is not intended to replace advice given to you by your health care provider. Make sure you discuss any questions you   have with your health care provider. Document Released: 03/11/2006 Document Revised: 02/05/2016 Document Reviewed: 02/05/2016 Elsevier Interactive Patient Education  2017 [employer] Pneumococcal Polysaccharide Vaccine: What You Need to Know 1. Why get vaccinated? Vaccination can protect older adults (and some children and younger adults) from pneumococcal disease. Pneumococcal disease is caused by bacteria that can spread from person to person through close contact. It can cause ear infections, and it can also lead to more serious infections of the:  Lungs (pneumonia),  Blood (bacteremia), and  Covering of the brain and spinal cord (meningitis). Meningitis can cause deafness and brain damage, and it can be fatal. Anyone can get pneumococcal disease, but  children under 532 years of age, people with certain medical conditions, adults over 29 years of age, and cigarette smokers are at the highest risk. About 18,000 older adults die each year from pneumococcal disease in the [place]. Treatment of pneumococcal infections with penicillin and other drugs used to be more effective. But some strains of the disease have become resistant to these drugs. This makes prevention of the disease, through vaccination, even more important. 2. Pneumococcal polysaccharide vaccine (PPSV23) Pneumococcal polysaccharide vaccine (PPSV23) protects against 23 types of pneumococcal bacteria. It will not prevent all pneumococcal disease. PPSV23 is recommended for:  All adults 29 years of age and older,  Anyone 2 through 29 years of age with certain long-term health problems,  Anyone 2 through 10664 years of age with a weakened immune system,  Adults 4319 through 29 years of age who smoke cigarettes or have asthma. Most people need only one dose of PPSV. A second dose is recommended for certain high-risk groups. People 965 and older should get a dose even if they have gotten one or more doses of the vaccine before they turned 65. Your healthcare provider can give you more information about these recommendations. Most healthy adults develop protection within 2 to 3 weeks of getting the shot. 3. Some people should not get this vaccine  Anyone who has had a life-threatening allergic reaction to PPSV should not get another dose.  Anyone who has a severe allergy to any component of PPSV should not receive it. Tell your provider if you have any severe allergies.  Anyone who is moderately or severely ill when the shot is scheduled may be asked to wait until they recover before getting the vaccine. Someone with a mild illness can usually be vaccinated.  Children less than 432 years of age should not receive this vaccine.  There is no evidence that PPSV is harmful to either a  pregnant woman or to her fetus. However, as a precaution, women who need the vaccine should be vaccinated before becoming pregnant, if possible. 4. Risks of a vaccine reaction With any medicine, including vaccines, there is a chance of side effects. These are usually mild and go away on their own, but serious reactions are also possible. About half of people who get PPSV have mild side effects, such as redness or pain where the shot is given, which go away within about two days. Less than 1 out of 100 people develop a fever, muscle aches, or more severe local reactions. Problems that could happen after any vaccine:  People sometimes faint after a medical procedure, including vaccination. Sitting or lying down for about 15 minutes can help prevent fainting, and injuries caused by a fall. Tell your doctor if you feel dizzy, or have vision changes or ringing in the ears.  Some people get  severe pain in the shoulder and have difficulty moving the arm where a shot was given. This happens very rarely.  Any medication can cause a severe allergic reaction. Such reactions from a vaccine are very rare, estimated at about 1 in a million doses, and would happen within a few minutes to a few hours after the vaccination. As with any medicine, there is a very remote chance of a vaccine causing a serious injury or death. The safety of vaccines is always being monitored. For more information, visit: http://floyd.org/www.cdc.gov/vaccinesafety/ 5. What if there is a serious reaction? What should I look for? Look for anything that concerns you, such as signs of a severe allergic reaction, very high fever, or unusual behavior. Signs of a severe allergic reaction can include hives, swelling of the face and throat, difficulty breathing, a fast heartbeat, dizziness, and weakness. These would usually start a few minutes to a few hours after the vaccination. What should I do? If you think it is a severe allergic reaction or other emergency  that can't wait, call 9-1-1 or get to the nearest hospital. Otherwise, call your doctor. Afterward, the reaction should be reported to the Vaccine Adverse Event Reporting System (VAERS). Your doctor might file this report, or you can do it yourself through the VAERS web site at www.vaers.LA[place]ents.nohhs.gov, or by calling 1-(312)662-5543. VAERS does not give medical advice. 6. How can I learn more?  Ask your doctor. He or she can give you the vaccine package insert or suggest other sources of information.  Call your local or state health department.  Contact the Centers for Disease Control and Prevention (CDC):  Call (828)229-95751-615-365-2087 (1-800-CDC-INFO) or  Visit CDC's website at PicCapture.uywww.cdc.gov/vaccines CDC Pneumococcal Polysaccharide Vaccine VIS (09/21/13) This information is not intended to replace advice given to you by your health care provider. Make sure you discuss any questions you have with your health care provider. -------------------------------------------------------------------------- Base your long acting insulin on your fasting (usually in the morning) blood sugar.  Increase long acting (daily insulin) 2 units if fasting blood sugar is greater than 120.  Decrease by 2 units if fasting blood sugar is less than 95.

## 2016-05-14 ENCOUNTER — Ambulatory Visit: Payer: Medicaid Other | Admitting: Unknown Physician Specialty

## 2016-05-18 ENCOUNTER — Telehealth: Payer: Self-pay

## 2016-05-18 MED ORDER — INSULIN GLARGINE 100 UNIT/ML SOLOSTAR PEN: 60.0000 [IU] | PEN_INJECTOR | Freq: Every day | SUBCUTANEOUS | 99 refills | Status: AC

## 2016-05-18 NOTE — Telephone Encounter (Signed)
Received a fax from [employer] stating that the patient is unable to start the prescription for Tresiba. Looked on the medicaid preferred medication list and patient must try lantus solostar vial or levemir flextouch/flexpen/ vial before tresiba will be covered. Can we send in one of these medication instead?

## 2016-05-18 NOTE — Telephone Encounter (Signed)
Called and let patient know about medication change.  

## 2016-06-03 ENCOUNTER — Ambulatory Visit: Payer: Medicaid Other | Admitting: Psychiatry

## 2016-06-07 ENCOUNTER — Ambulatory Visit: Payer: Medicaid Other | Admitting: Unknown Physician Specialty

## 2016-06-09 ENCOUNTER — Ambulatory Visit (INDEPENDENT_AMBULATORY_CARE_PROVIDER_SITE_OTHER): Payer: Medicaid Other | Admitting: Psychiatry

## 2016-06-09 ENCOUNTER — Encounter: Payer: Self-pay | Admitting: Psychiatry

## 2016-06-09 VITALS — BP 126/77 | HR 60 | Wt 281.4 lb

## 2016-06-09 DIAGNOSIS — F3130 Bipolar disorder, current episode depressed, mild or moderate severity, unspecified: Secondary | ICD-10-CM

## 2016-06-09 MED ORDER — ESCITALOPRAM OXALATE 10 MG PO TABS: 10.0000 mg | ORAL_TABLET | Freq: Every day | ORAL | 2 refills | Status: AC

## 2016-06-09 MED ORDER — DIVALPROEX SODIUM 250 MG PO DR TAB: 750.0000 mg | DELAYED_RELEASE_TABLET | Freq: Every evening | ORAL | 2 refills | Status: AC

## 2016-06-09 MED ORDER — TRAZODONE HCL 100 MG PO TABS: 100.0000 mg | ORAL_TABLET | Freq: Every day | ORAL | 2 refills | Status: AC

## 2016-06-09 MED ORDER — LURASIDONE HCL 40 MG PO TABS: 40.0000 mg | ORAL_TABLET | Freq: Every day | ORAL | 2 refills | Status: AC
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
## 2016-06-14 ENCOUNTER — Ambulatory Visit (INDEPENDENT_AMBULATORY_CARE_PROVIDER_SITE_OTHER): Payer: Medicaid Other | Admitting: Unknown Physician Specialty

## 2016-06-14 ENCOUNTER — Encounter: Payer: Self-pay | Admitting: Unknown Physician Specialty

## 2016-06-14 VITALS — BP 136/87 | HR 57 | Temp 97.8°F | Wt 281.6 lb

## 2016-06-14 DIAGNOSIS — E7800 Pure hypercholesterolemia, unspecified: Secondary | ICD-10-CM | POA: Diagnosis not present

## 2016-06-14 DIAGNOSIS — E1165 Type 2 diabetes mellitus with hyperglycemia: Secondary | ICD-10-CM | POA: Diagnosis not present

## 2016-06-14 LAB — BAYER DCA HB A1C WAIVED: HB A1C (BAYER DCA - WAIVED): 6.1 % (ref <7.0)

## 2016-06-14 LAB — MICROALBUMIN, URINE WAIVED
Creatinine, Urine Waived: 50 mg/dL (ref 10–300)
Microalb, Ur Waived: 10 mg/L (ref 0–19)

## 2016-06-14 MED ORDER — ATORVASTATIN CALCIUM 10 MG PO TABS
10.0000 mg | ORAL_TABLET | Freq: Every day | ORAL | 3 refills | Status: AC
Start: 2016-06-14 — End: ?

## 2016-06-14 NOTE — Progress Notes (Signed)
BP 136/87 (BP Location: Left Arm, Cuff Size: Large)   Pulse (!) 57   Temp 97.8 F (36.6 C)   Wt 281 lb 9.6 oz (127.7 kg)   SpO2 95%   BMI 36.95 kg/m    Subjective:    Patient ID: Kevin PlowmanMatthew Meza, male    DOB: 07-09-1985, 31 y.o.   MRN: [id number]  HPI: Kevin PlowmanMatthew Meza is a 31 y.o. male  Chief Complaint  Patient presents with  . Diabetes    3 week f/up   Diabetes: Pt states he is watching his carbs and dramatically changing his diet.  Taking 42 untis of Lantus BID.  Using medications without difficulties No hypoglycemic episodes No hyperglycemic episodes Feet problems: none Blood Sugars averaging: 130-140  Hypertension  Using medications without difficulty Average home BPs   Using medication without problems or lightheadedness No chest pain with exertion or shortness of breath No Edema  Elevated Cholesterol Using medications without problems No Muscle aches  Diet/Exercise: States he is working hard on this.  However, food is hard to come by due to finances and often eats unhealthy but inexpensive food such as Ramen noodles.   He is planning on moving to [place].  He is planning on getting his Medicaid switched.  Has a lot of anxiety about this and seeing a psychiatrist.    Relevant past medical, surgical, family and social history reviewed and updated as indicated. Interim medical history since our last visit reviewed. Allergies and medications reviewed and updated.  Review of Systems  Constitutional: Negative.   HENT: Negative.   Eyes: Negative.   Respiratory: Negative.   Cardiovascular: Negative.   Gastrointestinal: Negative.   Endocrine: Negative.   Genitourinary: Negative.   Skin: Negative.   Allergic/Immunologic: Negative.   Neurological: Negative.   Hematological: Negative.   Psychiatric/Behavioral: Negative.     Per HPI unless specifically indicated above     Objective:    BP 136/87 (BP Location: Left Arm, Cuff Size: Large)   Pulse (!) 57    Temp 97.8 F (36.6 C)   Wt 281 lb 9.6 oz (127.7 kg)   SpO2 95%   BMI 36.95 kg/m   Wt Readings from Last 3 Encounters:  06/14/16 281 lb 9.6 oz (127.7 kg)  05/13/16 282 lb 3.2 oz (128 kg)  04/13/16 276 lb 12.8 oz (125.6 kg)    Physical Exam  Constitutional: He is oriented to person, place, and time. He appears well-developed and well-nourished. No distress.  HENT:  Head: Normocephalic and atraumatic.  Eyes: Conjunctivae and lids are normal. Right eye exhibits no discharge. Left eye exhibits no discharge. No scleral icterus.  Neck: Normal range of motion. Neck supple. No JVD present. Carotid bruit is not present.  Cardiovascular: Normal rate, regular rhythm and normal heart sounds.   Pulmonary/Chest: Effort normal and breath sounds normal. No respiratory distress.  Abdominal: Normal appearance. There is no splenomegaly or hepatomegaly.  Musculoskeletal: Normal range of motion.  Neurological: He is alert and oriented to person, place, and time.  Skin: Skin is warm, dry and intact. No rash noted. No pallor.  Psychiatric: He has a normal mood and affect. His behavior is normal. Judgment and thought content normal.    Results for orders placed or performed in visit on 11/17/15  Lipid Panel Piccolo, [employer] ElectronicsWaived  Result Value Ref Range   Cholesterol Piccolo, Waived 238 (H) <200 mg/dL   HDL Chol Piccolo, Waived 36 (L) >59 mg/dL   Triglycerides Piccolo,Waived 341 (H) <150 mg/dL  Chol/HDL Ratio Piccolo,Waive 6.5 (H) mg/dL   LDL Chol Calc Piccolo Waived 134 (H) <100 mg/dL   VLDL Chol Calc Piccolo,Waive 68 (H) <30 mg/dL      Assessment & Plan:   Problem List Items Addressed This Visit      Unprioritized   Hypercholesteremia    Start statin due to risk factors      Relevant Medications   atorvastatin (LIPITOR) 10 MG tablet   Poorly controlled type 2 diabetes mellitus (HCC) - Primary    Hgb A1C is now 6.1.  Will send to lab for confirmation as the number doesn't make sense as home  readings show decent control but not normal.  For now, decrease by 2 units a day is BS is less than 140.        Relevant Medications   atorvastatin (LIPITOR) 10 MG tablet   Other Relevant Orders   Comprehensive metabolic panel   Bayer DCA Hb K0U Waived   Microalbumin, Urine Waived       Follow up plan: Return in about 2 weeks (around 06/28/2016).

## 2016-06-14 NOTE — Assessment & Plan Note (Signed)
Start statin due to risk factors

## 2016-06-14 NOTE — Assessment & Plan Note (Signed)
Hgb A1C is now 6.1.  Will send to lab for confirmation as the number doesn't make sense as home readings show decent control but not normal.  For now, decrease by 2 units a day is BS is less than 140.

## 2016-06-14 NOTE — Addendum Note (Signed)
Addended by: Gabriel CirriWICKER, Nadalie Laughner on: 06/14/2016 02:47 PM   Modules accepted: Orders

## 2016-06-15 LAB — COMPREHENSIVE METABOLIC PANEL
A/G RATIO: 1.6 (ref 1.2–2.2)
ALK PHOS: 64 IU/L (ref 39–117)
ALT: 47 IU/L — AB (ref 0–44)
AST: 25 IU/L (ref 0–40)
Albumin: 4.5 g/dL (ref 3.5–5.5)
BUN/Creatinine Ratio: 16 (ref 9–20)
BUN: 11 mg/dL (ref 6–20)
CALCIUM: 9.6 mg/dL (ref 8.7–10.2)
CHLORIDE: 99 mmol/L (ref 96–106)
CO2: 24 mmol/L (ref 18–29)
Creatinine, Ser: 0.68 mg/dL — ABNORMAL LOW (ref 0.76–1.27)
GFR calc Af Amer: 149 mL/min/{1.73_m2} (ref >59)
GFR, EST NON AFRICAN AMERICAN: 129 mL/min/{1.73_m2} (ref >59)
GLOBULIN, TOTAL: 2.8 g/dL (ref 1.5–4.5)
Glucose: 92 mg/dL (ref 65–99)
POTASSIUM: 5 mmol/L (ref 3.5–5.2)
SODIUM: 140 mmol/L (ref 134–144)
Total Protein: 7.3 g/dL (ref 6.0–8.5)

## 2016-06-15 LAB — HEMOGLOBIN A1C
ESTIMATED AVERAGE GLUCOSE: 128 mg/dL
HEMOGLOBIN A1C: 6.1 % — AB (ref 4.8–5.6)

## 2016-06-24 ENCOUNTER — Encounter: Payer: Self-pay | Admitting: Unknown Physician Specialty

## 2016-06-24 NOTE — Telephone Encounter (Signed)
Routing to provider  

## 2016-06-25 ENCOUNTER — Emergency Department: Payer: Medicaid Other

## 2016-06-25 ENCOUNTER — Emergency Department
Admission: EM | Admit: 2016-06-25 | Discharge: 2016-06-25 | Disposition: A | Discharge status: Home / Self Care | Payer: Medicaid Other | Attending: Student in an Organized Health Care Education/Training Program | Admitting: Student in an Organized Health Care Education/Training Program

## 2016-06-25 DIAGNOSIS — E119 Type 2 diabetes mellitus without complications: Secondary | ICD-10-CM | POA: Diagnosis not present

## 2016-06-25 DIAGNOSIS — Y999 Unspecified external cause status: Secondary | ICD-10-CM | POA: Insufficient documentation

## 2016-06-25 DIAGNOSIS — F17210 Nicotine dependence, cigarettes, uncomplicated: Secondary | ICD-10-CM | POA: Insufficient documentation

## 2016-06-25 DIAGNOSIS — Z794 Long term (current) use of insulin: Secondary | ICD-10-CM | POA: Insufficient documentation

## 2016-06-25 DIAGNOSIS — Y9302 Activity, running: Secondary | ICD-10-CM | POA: Insufficient documentation

## 2016-06-25 DIAGNOSIS — Y929 Unspecified place or not applicable: Secondary | ICD-10-CM | POA: Diagnosis not present

## 2016-06-25 DIAGNOSIS — S3992XA Unspecified injury of lower back, initial encounter: Secondary | ICD-10-CM | POA: Diagnosis present

## 2016-06-25 DIAGNOSIS — X501XXA Overexertion from prolonged static or awkward postures, initial encounter: Secondary | ICD-10-CM | POA: Diagnosis not present

## 2016-06-25 DIAGNOSIS — Z79899 Other long term (current) drug therapy: Secondary | ICD-10-CM | POA: Diagnosis not present

## 2016-06-25 DIAGNOSIS — S39012A Strain of muscle, fascia and tendon of lower back, initial encounter: Secondary | ICD-10-CM | POA: Diagnosis not present

## 2016-06-25 MED ORDER — HYDROMORPHONE HCL 1 MG/ML IJ SOLN
1.0000 mg | Freq: Once | INTRAMUSCULAR | Status: AC
  Administered 2016-06-25: 1 mg via INTRAMUSCULAR
  Filled 2016-06-25: qty 1

## 2016-06-25 MED ORDER — CYCLOBENZAPRINE HCL 10 MG PO TABS
10.0000 mg | ORAL_TABLET | Freq: Three times a day (TID) | ORAL | 0 refills | Status: AC | PRN
Start: 2016-06-25 — End: ?

## 2016-06-25 MED ORDER — KETOROLAC TROMETHAMINE 10 MG PO TABS: 10.0000 mg | ORAL_TABLET | Freq: Four times a day (QID) | ORAL | 0 refills | Status: AC | PRN

## 2016-06-25 MED ORDER — KETOROLAC TROMETHAMINE 60 MG/2ML IM SOLN
60.0000 mg | Freq: Once | INTRAMUSCULAR | Status: AC
  Administered 2016-06-25: 60 mg via INTRAMUSCULAR
  Filled 2016-06-25: qty 2

## 2016-06-25 MED ORDER — ORPHENADRINE CITRATE 30 MG/ML IJ SOLN
60.0000 mg | Freq: Two times a day (BID) | INTRAMUSCULAR | Status: DC
  Administered 2016-06-25: 60 mg via INTRAMUSCULAR
  Filled 2016-06-25: qty 2

## 2016-06-25 MED ORDER — OXYCODONE-ACETAMINOPHEN 7.5-325 MG PO TABS: 1.0000 | ORAL_TABLET | Freq: Four times a day (QID) | ORAL | 0 refills | Status: AC | PRN

## 2016-06-25 NOTE — ED Notes (Signed)
Patient reports yesterday as he was running after his dog he twisted the wrong way and has been having pain since. Painful to walk and has been using a crutch for support. Pt is supposed to be moving to [place] tomorrow.  Pt states he has thrown his back out before. Took 2 Tylenol #3 for pain that he had left over from dental procedure and states it helped some but not enough. Pt states he feels spasms in his back.

## 2016-06-25 NOTE — ED Provider Notes (Signed)
[facility] Emergency Department Provider Note   ____________________________________________   First MD Initiated Contact with Patient 06/25/16 1100     (approximate)  I have reviewed the triage vital signs and the nursing notes.   HISTORY  Chief Complaint Back Pain    HPI Kevin Meza is a 31 y.o. male patient complaining of acute back pain which care yesterday while chasing his dog. Patient stated he felt a "pop" in his back that before him to his knees.Patient denies any radicular component to this pain. Patient states unable to ambulate except with support. Patient describes his pain as spasmodic". Patient state has similar episode but this is the worse ever felt. Patient denies any radicular component to this pain. Patient denies any bladder or bowel dysfunction.   Past Medical History:  Diagnosis Date  . Adjustment disorder   . Agoraphobia   . Anxiety   . Bipolar disorder ([place])   . Depression   . Diabetes mellitus, type II [facility])     Patient Active Problem List   Diagnosis Date Noted  . Hypercholesteremia 11/17/2015  . Poorly controlled type 2 diabetes mellitus ([place]) 10/24/2015  . Elevated WBC count 10/24/2015  . Bipolar 2 disorder ([place]) 10/20/2015  . GERD (gastroesophageal reflux disease) 10/20/2015  . Gynecomastia, male 07/09/2015  . Keratosis pilaris 04/04/2015  . Chronic back pain 04/04/2015  . Tobacco abuse 04/04/2015  . Major depression 02/17/2015  . Agoraphobia with panic disorder 02/17/2015  . Panic disorder 02/17/2015  . Adjustment disorder 02/17/2015    Past Surgical History:  Procedure Laterality Date  . WISDOM TOOTH EXTRACTION  03/14/2016    Prior to Admission medications   Medication Sig Start Date End Date Taking? Authorizing Provider  ACCU-CHEK AVIVA PLUS test strip CHECK BLOOD SUGAR 4 TIMES PER DAY OR AS DIRECTED. 11/17/15   Kathrine Haddock, NP  ACCU-CHEK SOFTCLIX LANCETS lancets CHECK BLOOD SUGAR 4 TIMES PER DAY  OR AS DIRECTED. 11/17/15   Kathrine Haddock, NP  atorvastatin (LIPITOR) 10 MG tablet Take 1 tablet (10 mg total) by mouth daily. 06/14/16   Kathrine Haddock, NP  blood glucose meter kit and supplies KIT Dispense based on patient and insurance preference. Use up to four times daily as directed. E11.9 10/24/15   Kathrine Haddock, NP  cyclobenzaprine (FLEXERIL) 10 MG tablet Take 1 tablet (10 mg total) by mouth 3 (three) times daily as needed. 06/25/16   Sable Feil, PA-C  divalproex (DEPAKOTE) 250 MG DR tablet Take 3 tablets (750 mg total) by mouth every evening. Patient not taking: Reported on 06/14/2016 06/09/16   Rainey Pines, MD  escitalopram (LEXAPRO) 10 MG tablet Take 1 tablet (10 mg total) by mouth daily. 06/09/16   Rainey Pines, MD  Insulin Degludec (TRESIBA FLEXTOUCH) 200 UNIT/ML SOPN Inject 60 Units into the skin daily. Patient taking differently: Inject 84 Units into the skin daily.  05/13/16   Kathrine Haddock, NP  Insulin Glargine (LANTUS SOLOSTAR) 100 UNIT/ML Solostar Pen Inject 60 Units into the skin daily at 10 pm. Patient not taking: Reported on 06/14/2016 05/18/16   Kathrine Haddock, NP  Insulin Pen Needle 31G X 6 MM MISC 1 Units by Does not apply route daily. 10/29/15   Kathrine Haddock, NP  ketorolac (TORADOL) 10 MG tablet Take 1 tablet (10 mg total) by mouth every 6 (six) hours as needed. 06/25/16   Sable Feil, PA-C  lurasidone (LATUDA) 40 MG TABS tablet Take 1 tablet (40 mg total) by mouth daily with breakfast.  06/09/16   Rainey Pines, MD  metFORMIN (GLUCOPHAGE) 500 MG tablet Take 1 tablet (500 mg total) by mouth 2 (two) times daily with a meal. 10/24/15   Kathrine Haddock, NP  omeprazole (PRILOSEC) 20 MG capsule Take 1 capsule (20 mg total) by mouth daily. 10/20/15   Kathrine Haddock, NP  oxyCODONE-acetaminophen (PERCOCET) 7.5-325 MG tablet Take 1 tablet by mouth every 6 (six) hours as needed for severe pain. 06/25/16   Sable Feil, PA-C  traZODone (DESYREL) 100 MG tablet Take 1 tablet (100 mg total) by mouth  at bedtime. 1-2 pills at bedtime 06/09/16   Rainey Pines, MD    Allergies Patient has no known allergies.  Family History  Problem Relation Age of Onset  . Mitral valve prolapse Mother   . Emphysema Mother   . Hypertension Sister   . Allergies Sister   . Bipolar disorder Maternal Aunt   . Bipolar disorder Maternal Grandmother   . Diabetes Maternal Grandmother   . Hypertension Father   . Cancer Paternal Grandfather   . Bipolar disorder Maternal Uncle   . Diabetes Maternal Uncle     Social History Social History  Substance Use Topics  . Smoking status: Current Every Day Smoker    Packs/day: 0.50    Years: 9.00    Types: Cigarettes    Start date: 03/30/2005  . Smokeless tobacco: Never Used  . Alcohol use No    Review of Systems Constitutional: No fever/chills Eyes: No visual changes. ENT: No sore throat. Cardiovascular: Denies chest pain. Respiratory: Denies shortness of breath. Gastrointestinal: No abdominal pain.  No nausea, no vomiting.  No diarrhea.  No constipation. Genitourinary: Negative for dysuria. Musculoskeletal: Chronic back pain  Skin: Negative for rash. Neurological: Negative for headaches, focal weakness or numbness. Psychiatric:Major depression, adjustment disorder, panic disorder, agoraphobia,  and bipolar. Endocrine:Diabetes  ____________________________________________   PHYSICAL EXAM:  VITAL SIGNS: ED Triage Vitals  Enc Vitals Group     BP 06/25/16 0954 (!) 126/93     Pulse Rate 06/25/16 0954 87     Resp 06/25/16 0954 18     Temp 06/25/16 0954 97.5 F (36.4 C)     Temp Source 06/25/16 0954 Oral     SpO2 06/25/16 0954 95 %     Weight 06/25/16 0955 280 lb (127 kg)     Height 06/25/16 0955 6' 1"  (1.854 m)     Head Circumference --      Peak Flow --      Pain Score 06/25/16 0955 7     Pain Loc --      Pain Edu? --      Excl. in [place]? --     Constitutional: Alert and oriented. Well appearing and in no acute distress. Eyes:  Conjunctivae are normal. PERRL. EOMI. Head: Atraumatic. Nose: No congestion/rhinnorhea. Mouth/Throat: Mucous membranes are moist.  Oropharynx non-erythematous. Neck: No stridor.  No cervical spine tenderness to palpation. Hematological/Lymphatic/Immunilogical: No cervical lymphadenopathy. Cardiovascular: Normal rate, regular rhythm. Grossly normal heart sounds.  Good peripheral circulation. Respiratory: Normal respiratory effort.  No retractions. Lungs CTAB. Gastrointestinal: Soft and nontender. No distention. No abdominal bruits. No CVA tenderness. Musculoskeletal: No lower extremity tenderness nor edema.  No joint effusions. Neurologic:  Normal speech and language. No gross focal neurologic deficits are appreciated. No gait instability. Skin:  Skin is warm, dry and intact. No rash noted. Psychiatric: Mood and affect are normal. Speech and behavior are normal.  ____________________________________________   LABS (all labs ordered are listed,  but only abnormal results are displayed)  Labs Reviewed - No data to display ____________________________________________  EKG   ____________________________________________  RADIOLOGY   ______ no acute findings on x-ray of lumbar spine. ______________________________________   PROCEDURES  Procedure(s) performed: None  Procedures  Critical Care performed: No  ____________________________________________   INITIAL IMPRESSION / ASSESSMENT AND PLAN / ED COURSE  Pertinent labs & imaging results that were available during my care of the patient were reviewed by me and considered in my medical decision making (see chart for details).  Acute lumbar strain. Patient given discharge care instructions. Patient given prescription for Percocets, Toradol, and Flexeril. Patient advised follow-up family doctor condition persists.      ____________________________________________   FINAL CLINICAL IMPRESSION(S) / ED DIAGNOSES  Final  diagnoses:  Strain of lumbar region, initial encounter      NEW MEDICATIONS STARTED DURING THIS VISIT:  Discharge Medication List as of 06/25/2016  1:06 PM    START taking these medications   Details  cyclobenzaprine (FLEXERIL) 10 MG tablet Take 1 tablet (10 mg total) by mouth 3 (three) times daily as needed., Starting Fri 06/25/2016, Print    ketorolac (TORADOL) 10 MG tablet Take 1 tablet (10 mg total) by mouth every 6 (six) hours as needed., Starting Fri 06/25/2016, Print    oxyCODONE-acetaminophen (PERCOCET) 7.5-325 MG tablet Take 1 tablet by mouth every 6 (six) hours as needed for severe pain., Starting Fri 06/25/2016, Print         Note:  This document was prepared using Dragon voice recognition software and may include unintentional dictation errors.    Sable Feil, PA-C 06/25/16 1847    Merlyn Lot, MD 06/26/16 1017

## 2016-06-25 NOTE — ED Notes (Signed)
Pt and mother up to nurse's station multiple times stating that he is in pain and needs to see a doctor and that they have been waiting for over 2.5 hours.  Patient informed again that the provider is coming to see them, they are working with other patients at the moment.  Th[facility]s [facility] [place] [facility]. Informed them if they do leave to come and let us know.

## 2016-06-25 NOTE — ED Triage Notes (Signed)
Pt reports threw back out while playing with dog yesterday

## 2016-06-29 ENCOUNTER — Ambulatory Visit: Payer: Self-pay | Admitting: Unknown Physician Specialty

## 2017-04-09 IMAGING — CR DG LUMBAR SPINE COMPLETE 4+V
1 series · 5 of 5 positions shown · non-contrast
Comparison: None.

CLINICAL DATA: Recent injury while running, initial encounter

EXAM:
LUMBAR SPINE - COMPLETE 4+ VIEW

[Series 1: w lumbar spine ap · 0.14mm/px · 5 of 5 slices shown]
[im 1/5]
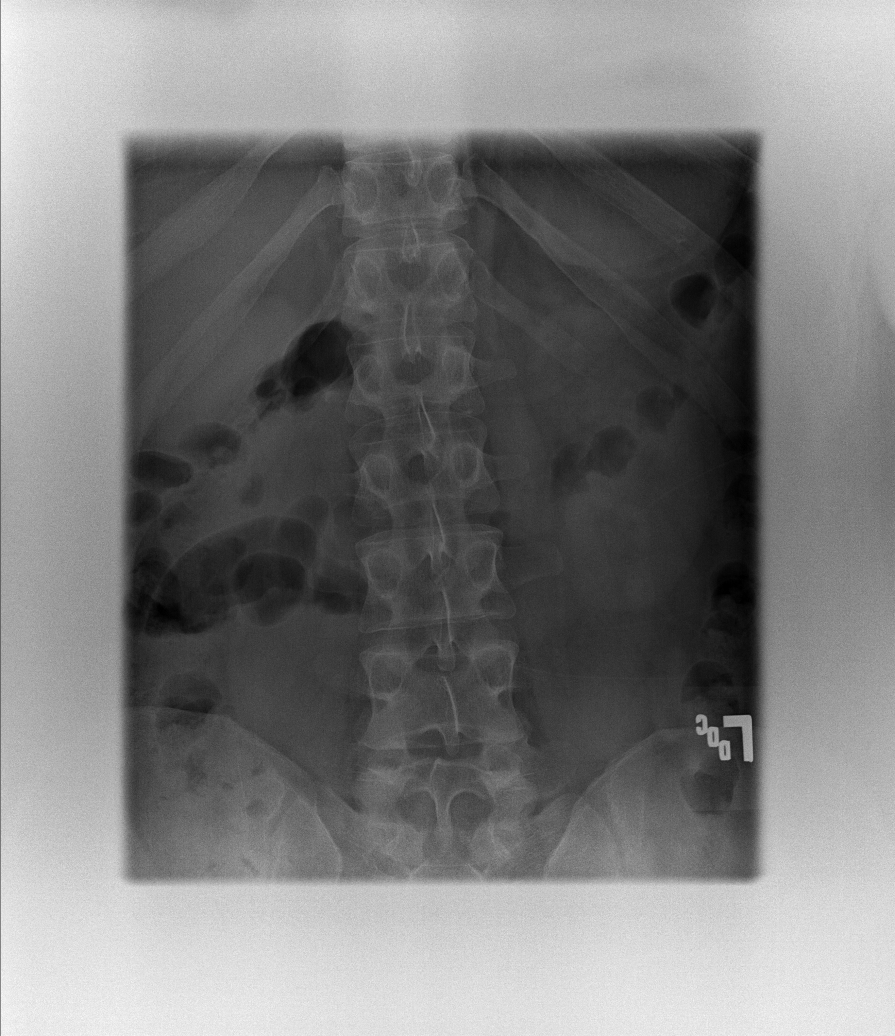
[im 2/5]
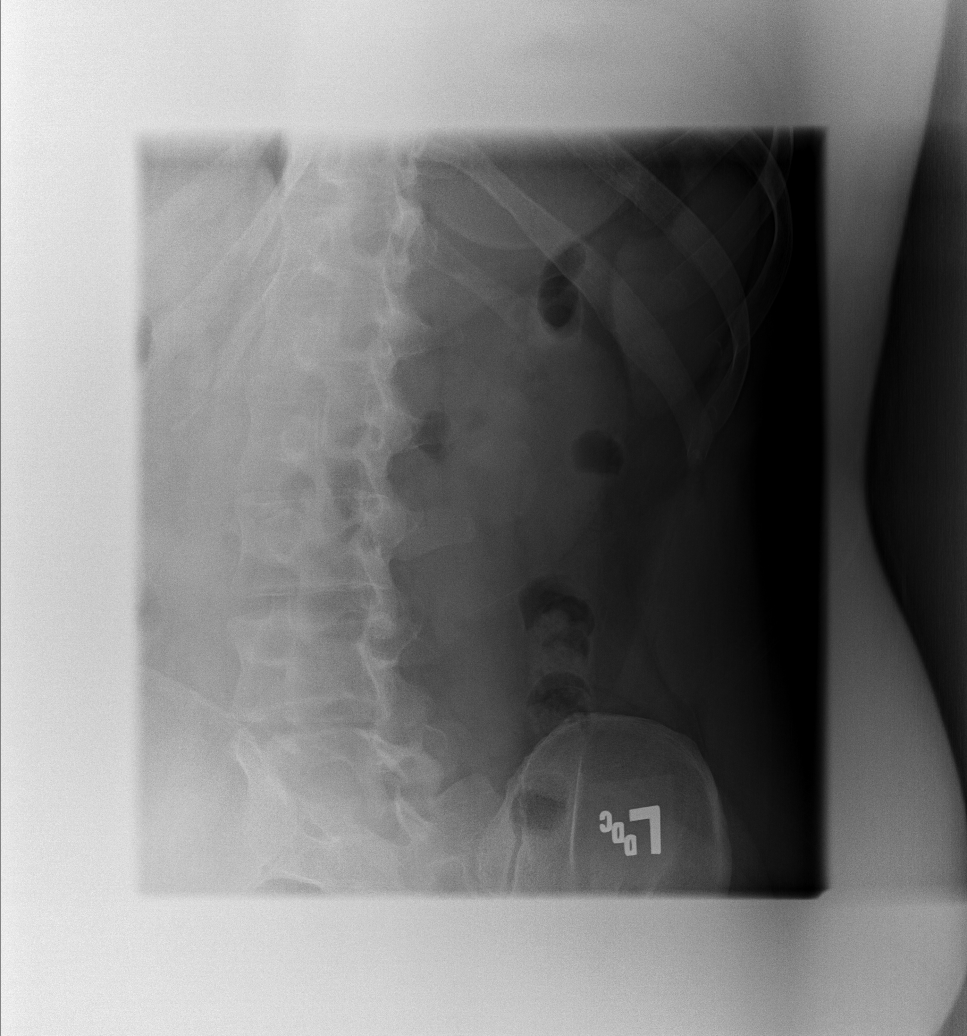
[im 3/5]
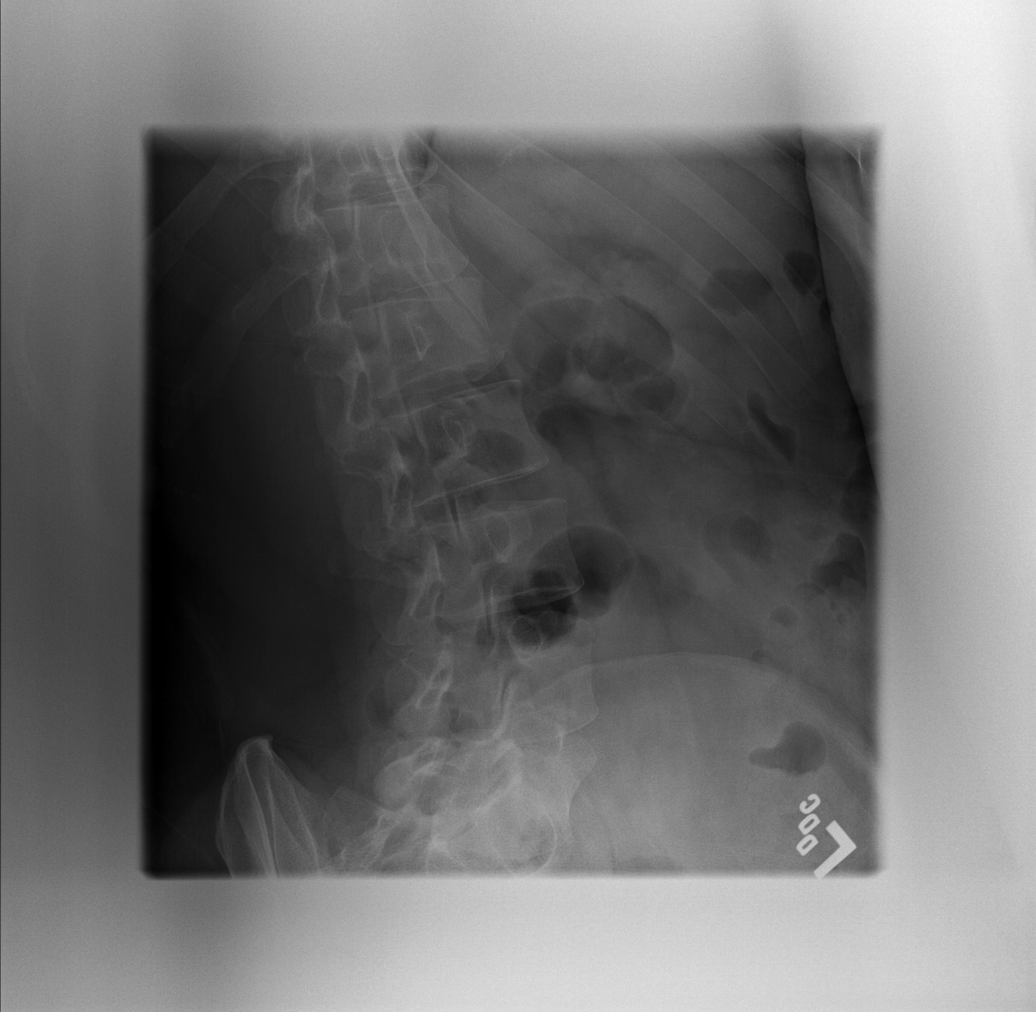
[im 4/5]
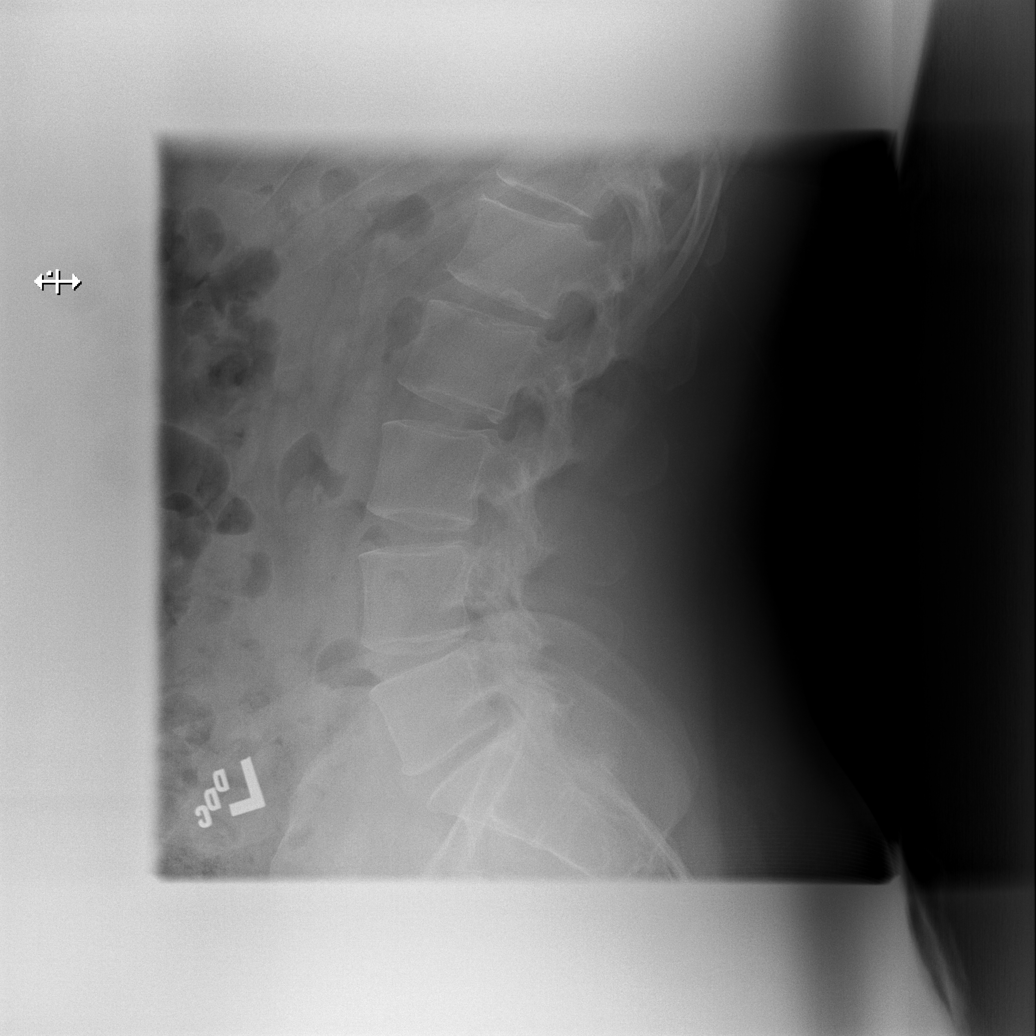
[im 5/5]
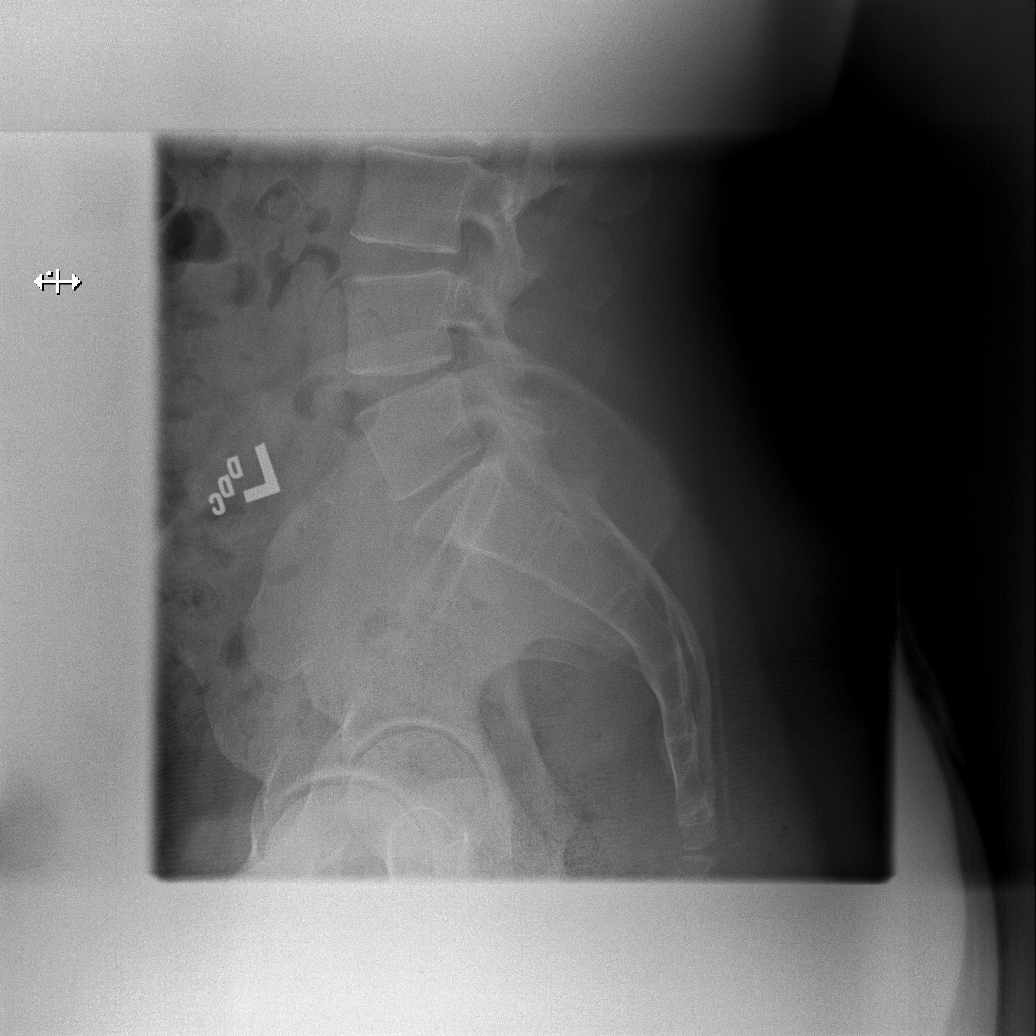

[5 of 5 positions shown; findings below may reference images not displayed]

FINDINGS: There is no evidence of lumbar spine fracture. Alignment is normal.
Intervertebral disc spaces are maintained.
IMPRESSION: No acute abnormality noted.
